# Patient Record
Sex: Male | Born: 1965 | Race: White | Hispanic: No | Smoking: Never smoker
Health system: Southern US, Community
[De-identification: ages and names within clinical notes are randomized; demographics above are authoritative.]

## PROBLEM LIST (undated history)

## (undated) DIAGNOSIS — B019 Varicella without complication: Secondary | ICD-10-CM

## (undated) HISTORY — PX: MENISCUS REPAIR: SHX5179

## (undated) HISTORY — DX: Varicella without complication: B01.9

---

## 2009-12-30 ENCOUNTER — Emergency Department: Payer: Self-pay | Admitting: Emergency Medicine

## 2010-06-13 IMAGING — CR RIGHT HIP - COMPLETE 2+ VIEW
1 series · 2 of 2 positions shown · non-contrast
Comparison: none

REASON FOR EXAM: right hip pain
COMMENTS:

PROCEDURE:     DXR - DXR HIP RIGHT COMPLETE  - December 30, 2009  [DATE]
RESULT:     No fracture, dislocation or other acute bony abnormality is
identified. The hip joint space is well-maintained.

[Series 1: view not recorded · 0.17mm/px · 2 of 2 slices shown]
[im 1/2]
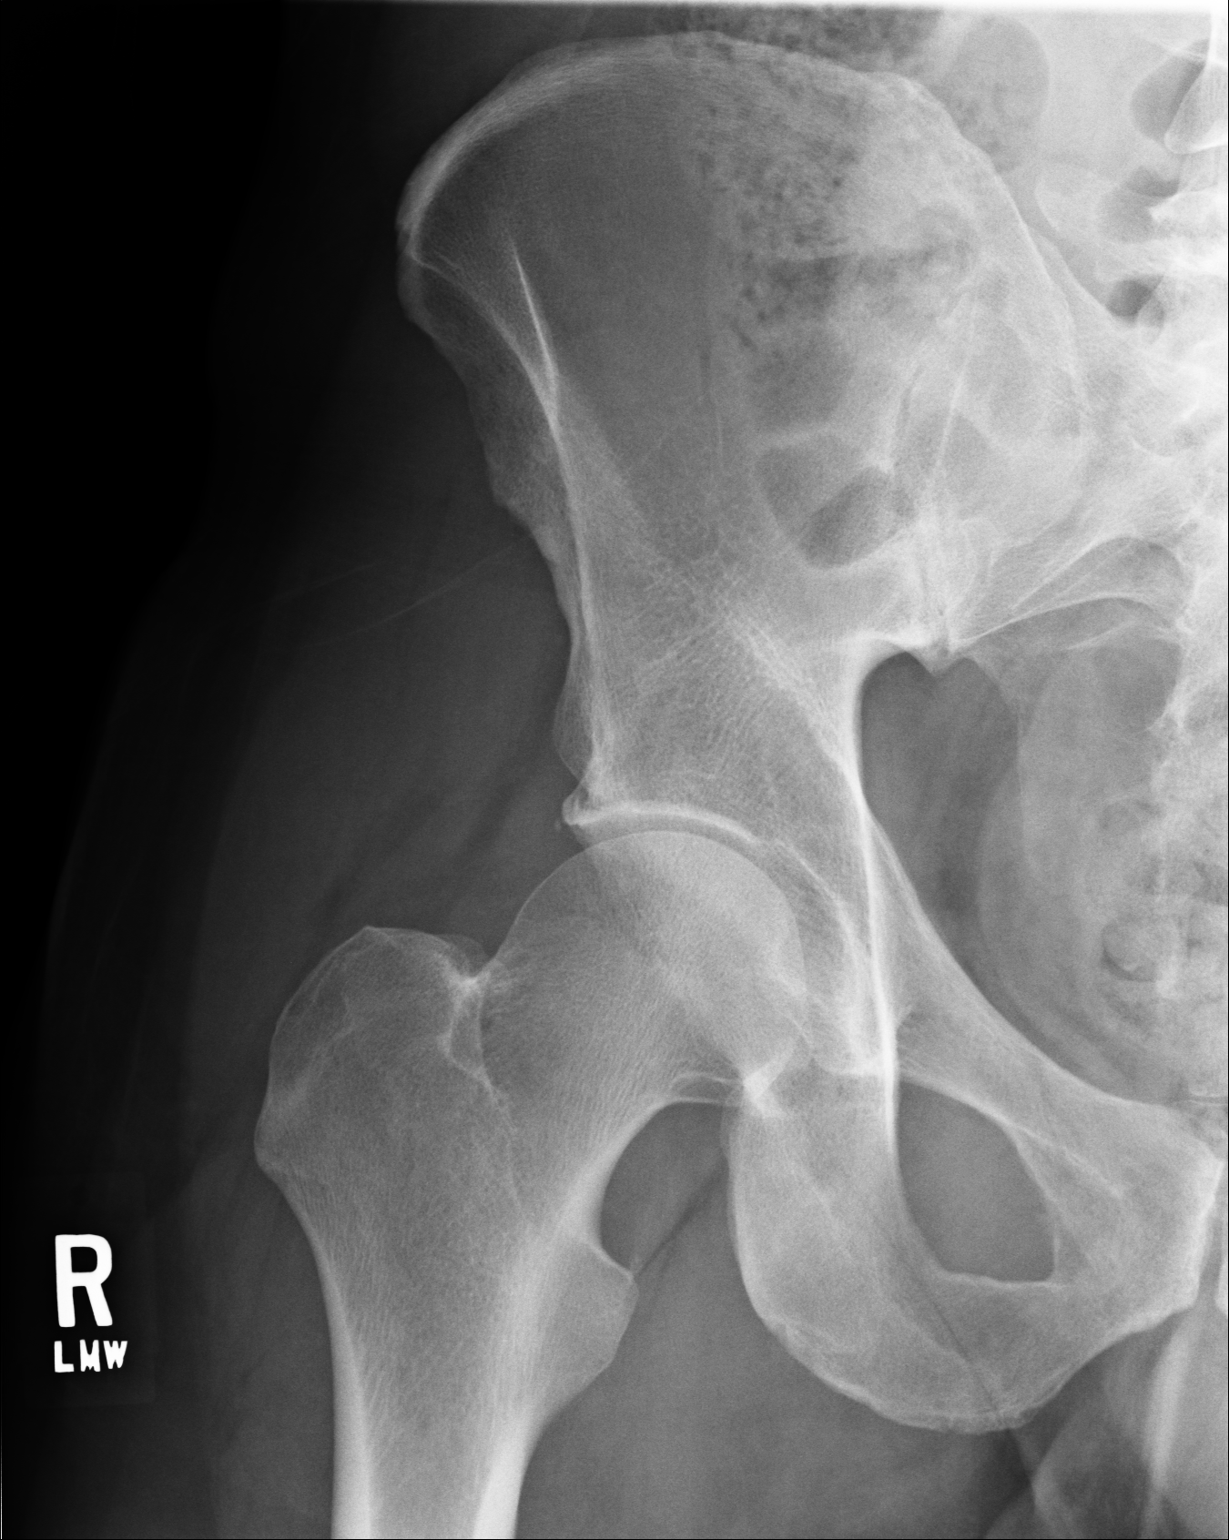
[im 2/2]
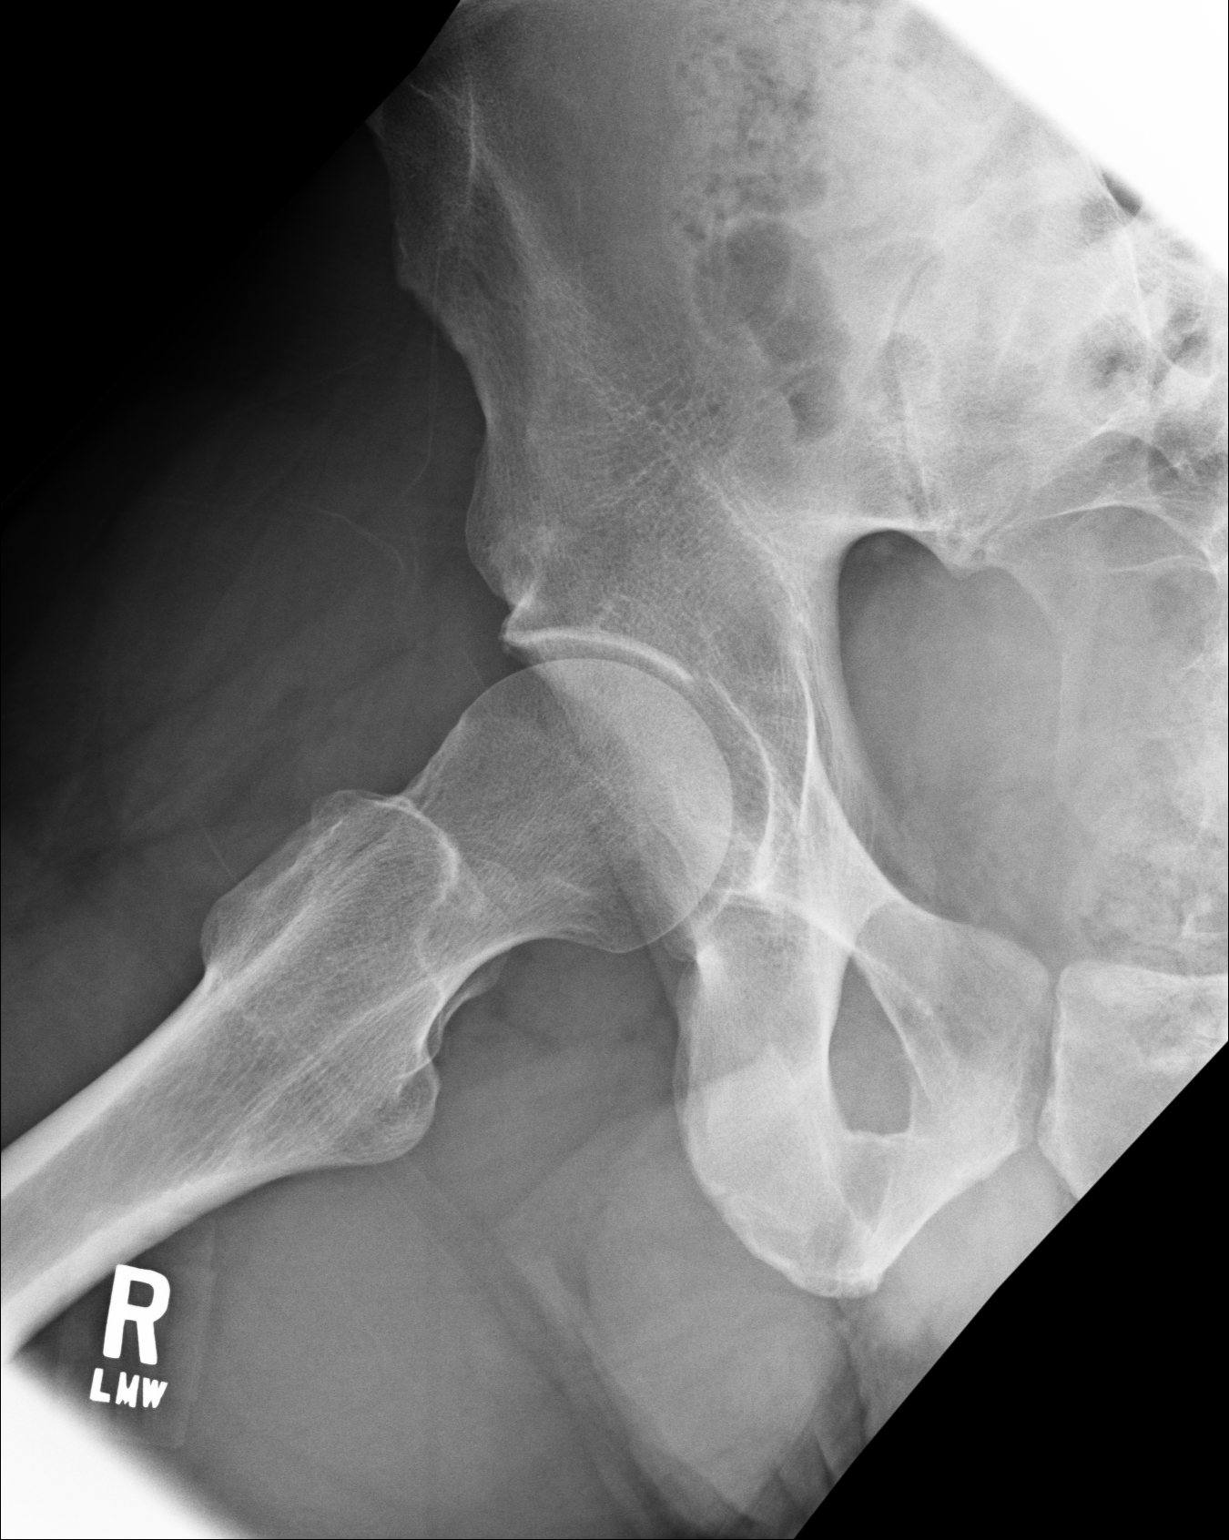

[2 of 2 positions shown; findings below may reference images not displayed]

IMPRESSION: No acute changes are identified.

## 2015-05-05 ENCOUNTER — Encounter: Payer: Self-pay | Admitting: Primary Care

## 2015-05-05 ENCOUNTER — Encounter (INDEPENDENT_AMBULATORY_CARE_PROVIDER_SITE_OTHER): Payer: Self-pay

## 2015-05-05 ENCOUNTER — Ambulatory Visit (INDEPENDENT_AMBULATORY_CARE_PROVIDER_SITE_OTHER): Payer: BLUE CROSS/BLUE SHIELD | Admitting: Primary Care

## 2015-05-05 VITALS — BP 136/76 | HR 65 | Temp 98.3°F | Ht 71.0 in | Wt 211.1 lb

## 2015-05-05 DIAGNOSIS — Z0000 Encounter for general adult medical examination without abnormal findings: Secondary | ICD-10-CM | POA: Insufficient documentation

## 2015-05-05 DIAGNOSIS — Z23 Encounter for immunization: Secondary | ICD-10-CM

## 2015-05-05 LAB — CBC WITH DIFFERENTIAL/PLATELET
Basophils Absolute: 0 10*3/uL (ref 0.0–0.1)
Basophils Relative: 0.5 % (ref 0.0–3.0)
Eosinophils Absolute: 0.2 10*3/uL (ref 0.0–0.7)
Eosinophils Relative: 4 % (ref 0.0–5.0)
HCT: 41.6 % (ref 39.0–52.0)
Hemoglobin: 14.4 g/dL (ref 13.0–17.0)
Lymphocytes Relative: 30.9 % (ref 12.0–46.0)
Lymphs Abs: 1.7 10*3/uL (ref 0.7–4.0)
MCHC: 34.5 g/dL (ref 30.0–36.0)
MCV: 90 fl (ref 78.0–100.0)
Monocytes Absolute: 0.5 10*3/uL (ref 0.1–1.0)
Monocytes Relative: 8.8 % (ref 3.0–12.0)
Neutro Abs: 3 10*3/uL (ref 1.4–7.7)
Neutrophils Relative %: 55.8 % (ref 43.0–77.0)
Platelets: 196 10*3/uL (ref 150.0–400.0)
RBC: 4.62 Mil/uL (ref 4.22–5.81)
RDW: 12.5 % (ref 11.5–15.5)
WBC: 5.4 10*3/uL (ref 4.0–10.5)

## 2015-05-05 LAB — LIPID PANEL
CHOL/HDL RATIO: 4
Cholesterol: 208 mg/dL — ABNORMAL HIGH (ref 0–200)
HDL: 56.6 mg/dL (ref >39.00)
LDL Cholesterol: 125 mg/dL — ABNORMAL HIGH (ref 0–99)
NONHDL: 151.4
TRIGLYCERIDES: 131 mg/dL (ref 0.0–149.0)
VLDL: 26.2 mg/dL (ref 0.0–40.0)

## 2015-05-05 LAB — COMPREHENSIVE METABOLIC PANEL WITH GFR
ALT: 22 U/L (ref 0–53)
AST: 25 U/L (ref 0–37)
Albumin: 4.2 g/dL (ref 3.5–5.2)
Alkaline Phosphatase: 53 U/L (ref 39–117)
BUN: 19 mg/dL (ref 6–23)
CO2: 32 meq/L (ref 19–32)
Calcium: 9.6 mg/dL (ref 8.4–10.5)
Chloride: 102 meq/L (ref 96–112)
Creatinine, Ser: 1.12 mg/dL (ref 0.40–1.50)
GFR: 73.7 mL/min
Glucose, Bld: 96 mg/dL (ref 70–99)
Potassium: 4.4 meq/L (ref 3.5–5.1)
Sodium: 138 meq/L (ref 135–145)
Total Bilirubin: 0.5 mg/dL (ref 0.2–1.2)
Total Protein: 6.7 g/dL (ref 6.0–8.3)

## 2015-05-05 LAB — PSA: PSA: 0.72 ng/mL (ref 0.10–4.00)

## 2015-05-05 LAB — TSH: TSH: 2.06 u[IU]/mL (ref 0.35–4.50)

## 2015-05-05 NOTE — Assessment & Plan Note (Signed)
Requires physical for foster care application. He's not seen a PCP in 15-20 years. Tetanus unknown. Tdap today. Due for colonoscopy, declines.  No recent eye or dental exam, recommendations provided. Labs today. Will notify patient of results.

## 2015-05-05 NOTE — Patient Instructions (Signed)
You've been given a Tetanus vaccine today. You will be covered for 10 years. Complete lab work prior to leaving today. I will notify you of your results. Follow up in one year for repeat physical or sooner if needed.

## 2015-05-05 NOTE — Progress Notes (Signed)
Subjective:    Patient ID: Zachary LentJames D Hibbitts, male    DOB: 12/26/1965, 49 y.o.   MRN: [id number]  HPI  Mr. [name] is a 49 year old male who presents today to establish care and is requesting a physical which is required for foster care.   Immunizations: -Tetanus: Unknown, believes it's been over 10 years ago.  -Influenza: Did not receive last season.  Diet: Diet consists of salads, lean meat, vegetables, fruits. Limited fast food, sweets. Drinks mostly water, 1 glass of red wine at night.  Exercise: Exercises three days a week at the gym. Lifts weights, some cardio. Eye exam: None recently.  Dental exam: 10 years ago.  Colonoscopy: He has not had one and declines today.      Review of Systems  Constitutional: Negative for fatigue and unexpected weight change.  HENT: Negative for rhinorrhea.   Respiratory: Negative for cough and shortness of breath.   Cardiovascular: Negative for chest pain.  Gastrointestinal: Negative for diarrhea and constipation.  Genitourinary: Negative for dysuria, frequency and difficulty urinating.  Musculoskeletal: Negative for myalgias and arthralgias.  Skin: Negative for rash.  Allergic/Immunologic: Negative for environmental allergies.  Neurological: Negative for dizziness and headaches.  Psychiatric/Behavioral:       Denies concerns for anxiety or depression.       Past Medical History  Diagnosis Date  . Chicken pox     History   Social History  . Marital Status: Unknown    Spouse Name: N/A  . Number of Children: N/A  . Years of Education: N/A   Occupational History  . Not on file.   Social History Main Topics  . Smoking status: Never Smoker   . Smokeless tobacco: Not on file  . Alcohol Use: 0.0 oz/week    0 Standard drinks or equivalent per week     Comment: occ  . Drug Use: Not on file  . Sexual Activity: Not on file   Other Topics Concern  . Not on file   Social History Narrative   Works as a Insurance account managercomputer analysist   Married.    4 children.    Enjoys gardening, traveling.       Past Surgical History  Procedure Laterality Date  . Meniscus repair Right     Family History  Problem Relation Age of Onset  . Dementia Mother     No Known Allergies  No current outpatient prescriptions on file prior to visit.   No current facility-administered medications on file prior to visit.    BP 136/76 mmHg  Pulse 65  Temp(Src) 98.3 F (36.8 C) (Oral)  Ht 5\' 11"  (1.803 m)  Wt 211 lb 1.9 oz (95.763 kg)  BMI 29.46 kg/m2  SpO2 97%    Objective:   Physical Exam  Constitutional: He is oriented to person, place, and time. He appears well-nourished.  HENT:  Right Ear: Tympanic membrane and ear canal normal.  Left Ear: Tympanic membrane and ear canal normal.  Nose: Nose normal.  Mouth/Throat: Oropharynx is clear and moist.  Eyes: Conjunctivae and EOM are normal. Pupils are equal, round, and reactive to light.  Neck: Neck supple.  Cardiovascular: Normal rate and regular rhythm.   Pulmonary/Chest: Effort normal and breath sounds normal.  Abdominal: Soft. Bowel sounds are normal. He exhibits no mass. There is no tenderness.  Musculoskeletal: Normal range of motion.  Lymphadenopathy:    He has no cervical adenopathy.  Neurological: He is alert and oriented to person, place, and time. He  has normal reflexes. No cranial nerve deficit. Coordination normal.  Skin: Skin is warm and dry.  Psychiatric: He has a normal mood and affect.          Assessment & Plan:

## 2015-05-05 NOTE — Progress Notes (Signed)
Pre visit review using our clinic review tool, if applicable. No additional management support is needed unless otherwise documented below in the visit note. 

## 2015-05-06 ENCOUNTER — Encounter: Payer: Self-pay | Admitting: *Deleted

## 2015-05-06 ENCOUNTER — Telehealth: Payer: Self-pay | Admitting: Primary Care

## 2015-05-06 NOTE — Telephone Encounter (Signed)
Pts wife brought in a form to be filled out for pts physical done on 05/05/15.  Best phone number to contact pt at is [phone] per pts wife.  The form will be on Chan's desk.

## 2015-05-06 NOTE — Telephone Encounter (Signed)
Place in [place] Corner[place]'s inbox to be complete.

## 2015-05-09 NOTE — Telephone Encounter (Signed)
Completed.

## 2015-05-09 NOTE — Telephone Encounter (Signed)
Left voicemail for patient that paperwork is ready for pick. Left in front office. Including for patient to pick up--DSS form and letter with lab results and [name]'s comments.

## 2015-11-23 ENCOUNTER — Telehealth: Payer: Self-pay | Admitting: Primary Care

## 2015-11-23 NOTE — Telephone Encounter (Signed)
Sheri called back and verbally gave her the patient's BP so they can complete the foster care paperwork.

## 2015-11-23 NOTE — Telephone Encounter (Signed)
sherri called Jae D[place]Kate saw pt in may to fill out foster care forms.  Pt bp was not on form  She needs this Please advise

## 2015-11-23 NOTE — Telephone Encounter (Signed)
Message left for Mammie LorenzoSherri Fordto return my call.

## 2015-11-23 NOTE — Telephone Encounter (Signed)
sherri returned your call

## 2015-11-23 NOTE — Telephone Encounter (Signed)
Message left for Sherri Fordto return my call. 

## 2017-08-27 ENCOUNTER — Ambulatory Visit (INDEPENDENT_AMBULATORY_CARE_PROVIDER_SITE_OTHER): Payer: BLUE CROSS/BLUE SHIELD | Admitting: Primary Care

## 2017-08-27 ENCOUNTER — Encounter: Payer: Self-pay | Admitting: Primary Care

## 2017-08-27 ENCOUNTER — Other Ambulatory Visit: Payer: Self-pay | Admitting: Primary Care

## 2017-08-27 VITALS — BP 120/76 | HR 65 | Temp 98.3°F | Ht 71.0 in | Wt 208.8 lb

## 2017-08-27 DIAGNOSIS — Z125 Encounter for screening for malignant neoplasm of prostate: Secondary | ICD-10-CM

## 2017-08-27 DIAGNOSIS — Z0000 Encounter for general adult medical examination without abnormal findings: Secondary | ICD-10-CM | POA: Diagnosis not present

## 2017-08-27 DIAGNOSIS — E875 Hyperkalemia: Secondary | ICD-10-CM

## 2017-08-27 LAB — LIPID PANEL
Cholesterol: 205 mg/dL — ABNORMAL HIGH (ref 0–200)
HDL: 59.1 mg/dL (ref >39.00)
LDL CALC: 132 mg/dL — AB (ref 0–99)
NonHDL: 145.77
Total CHOL/HDL Ratio: 3
Triglycerides: 67 mg/dL (ref 0.0–149.0)
VLDL: 13.4 mg/dL (ref 0.0–40.0)

## 2017-08-27 LAB — COMPREHENSIVE METABOLIC PANEL
ALBUMIN: 4.2 g/dL (ref 3.5–5.2)
ALT: 25 U/L (ref 0–53)
AST: 26 U/L (ref 0–37)
Alkaline Phosphatase: 43 U/L (ref 39–117)
BILIRUBIN TOTAL: 0.5 mg/dL (ref 0.2–1.2)
BUN: 20 mg/dL (ref 6–23)
CHLORIDE: 102 meq/L (ref 96–112)
CO2: 31 meq/L (ref 19–32)
Calcium: 9.4 mg/dL (ref 8.4–10.5)
Creatinine, Ser: 1.17 mg/dL (ref 0.40–1.50)
GFR: 69.44 mL/min (ref >60.00)
Glucose, Bld: 92 mg/dL (ref 70–99)
Potassium: 5.4 mEq/L — ABNORMAL HIGH (ref 3.5–5.1)
Sodium: 139 mEq/L (ref 135–145)
Total Protein: 6.5 g/dL (ref 6.0–8.3)

## 2017-08-27 LAB — PSA: PSA: 1.26 ng/mL (ref 0.10–4.00)

## 2017-08-27 NOTE — Progress Notes (Signed)
Subjective:    Patient ID: Zachary Ryan, male    DOB: 11/05/1966, 51 y.o.   MRN: [id number]  HPI  Zachary Ryan is a 51 year old male who presents today for complete physical.  Immunizations: -Tetanus: Completed in 2016 -Influenza: Declines   Diet: He endorses a healthy diet. Breakfast: Protein shake, eggs Lunch: Protein shake, left overs Dinner: Chicken, beef, fish, vegetables, starch Snacks: None Desserts: Rarely  Beverages: Water, protein shakes, occasional beer, wine every night.  Exercise: Exercises three times weekly for 1 hour. Eye exam: Completed in 2018 Dental exam: Has not completed recently. Colonoscopy: Declines PSA: Due.   Review of Systems  Constitutional: Negative for unexpected weight change.  HENT: Negative for rhinorrhea.   Respiratory: Negative for cough and shortness of breath.   Cardiovascular: Negative for chest pain.  Gastrointestinal: Negative for constipation and diarrhea.  Genitourinary: Negative for difficulty urinating.  Musculoskeletal: Negative for arthralgias and myalgias.  Skin: Negative for rash.  Allergic/Immunologic: Negative for environmental allergies.  Neurological: Negative for dizziness, numbness and headaches.  Psychiatric/Behavioral:       Denies concerns for anxiety or depression       Past Medical History:  Diagnosis Date  . Chicken pox      Social History   Social History  . Marital status: Unknown    Spouse name: N/A  . Number of children: N/A  . Years of education: N/A   Occupational History  . Not on file.   Social History Main Topics  . Smoking status: Never Smoker  . Smokeless tobacco: Never Used  . Alcohol use 0.0 oz/week     Comment: occ  . Drug use: Unknown  . Sexual activity: Not on file   Other Topics Concern  . Not on file   Social History Narrative   Works as a Insurance account managercomputer analysist   Married.    4 children.    Enjoys gardening, traveling.       Past Surgical History:  Procedure  Laterality Date  . MENISCUS REPAIR Right     Family History  Problem Relation Age of Onset  . Dementia Mother     No Known Allergies  No current outpatient prescriptions on file prior to visit.   No current facility-administered medications on file prior to visit.     BP 120/76   Pulse 65   Temp 98.3 F (36.8 C) (Oral)   Ht 5\' 11"  (1.803 m)   Wt 208 lb 12.8 oz (94.7 kg)   SpO2 98%   BMI 29.12 kg/m    Objective:   Physical Exam  Constitutional: He is oriented to person, place, and time. He appears well-nourished.  HENT:  Right Ear: Tympanic membrane and ear canal normal.  Left Ear: Tympanic membrane and ear canal normal.  Nose: Nose normal. Right sinus exhibits no maxillary sinus tenderness and no frontal sinus tenderness. Left sinus exhibits no maxillary sinus tenderness and no frontal sinus tenderness.  Mouth/Throat: Oropharynx is clear and moist.  Eyes: Pupils are equal, round, and reactive to light. Conjunctivae and EOM are normal.  Neck: Neck supple. Carotid bruit is not present. No thyromegaly present.  Cardiovascular: Normal rate, regular rhythm and normal heart sounds.   Pulmonary/Chest: Effort normal and breath sounds normal. He has no wheezes. He has no rales.  Abdominal: Soft. Bowel sounds are normal. There is no tenderness.  Musculoskeletal: Normal range of motion.  Neurological: He is alert and oriented to person, place, and time. He has normal  reflexes. No cranial nerve deficit.  Skin: Skin is warm and dry.  Psychiatric: He has a normal mood and affect.          Assessment & Plan:

## 2017-08-27 NOTE — Assessment & Plan Note (Signed)
Td UTD, declines influenza vaccination. PSA pending. Declines colonoscopy despite recommendations. Commended him on his regular activity, discussed to increase vegetables, fruit, whole grains. Exam unremarkable. Labs pending. Follow up annually. Form completed today.

## 2017-08-27 NOTE — Patient Instructions (Signed)
Complete lab work prior to leaving today. I will notify you of your results once received.   Continue exercising. You should be getting 150 minutes of moderate intensity exercise weekly.  Ensure you are consuming 64 ounces of water daily.  I do recommend a colonoscopy to screen for colon cancer.  Follow up in 1 year for your annual exam or sooner if needed.  It was a pleasure to see you today!

## 2017-09-05 ENCOUNTER — Other Ambulatory Visit: Payer: BLUE CROSS/BLUE SHIELD

## 2018-08-06 ENCOUNTER — Telehealth: Payer: Self-pay | Admitting: Primary Care

## 2018-08-06 NOTE — Telephone Encounter (Signed)
Okay to schedule sooner as long it is not next to another CPE, hospital follow up, or new patient.

## 2018-08-06 NOTE — Telephone Encounter (Signed)
Spouse schedule cpx for pt 10/16/18 last cpx 08/27/18 she wanted to know if pt could be seen sooner Please advise

## 2018-08-08 NOTE — Telephone Encounter (Signed)
Left message asking pt to call office  °

## 2018-10-16 ENCOUNTER — Ambulatory Visit (INDEPENDENT_AMBULATORY_CARE_PROVIDER_SITE_OTHER): Payer: BLUE CROSS/BLUE SHIELD | Admitting: Primary Care

## 2018-10-16 ENCOUNTER — Encounter: Payer: Self-pay | Admitting: Primary Care

## 2018-10-16 DIAGNOSIS — Z0000 Encounter for general adult medical examination without abnormal findings: Secondary | ICD-10-CM | POA: Diagnosis not present

## 2018-10-16 LAB — COMPREHENSIVE METABOLIC PANEL
ALK PHOS: 46 U/L (ref 39–117)
ALT: 27 U/L (ref 0–53)
AST: 28 U/L (ref 0–37)
Albumin: 4.3 g/dL (ref 3.5–5.2)
BILIRUBIN TOTAL: 0.7 mg/dL (ref 0.2–1.2)
BUN: 22 mg/dL (ref 6–23)
CO2: 32 meq/L (ref 19–32)
CREATININE: 1.26 mg/dL (ref 0.40–1.50)
Calcium: 9.5 mg/dL (ref 8.4–10.5)
Chloride: 103 mEq/L (ref 96–112)
GFR: 63.47 mL/min (ref >60.00)
GLUCOSE: 92 mg/dL (ref 70–99)
Potassium: 5.5 mEq/L — ABNORMAL HIGH (ref 3.5–5.1)
Sodium: 139 mEq/L (ref 135–145)
TOTAL PROTEIN: 6.8 g/dL (ref 6.0–8.3)

## 2018-10-16 LAB — LIPID PANEL
CHOL/HDL RATIO: 3
Cholesterol: 212 mg/dL — ABNORMAL HIGH (ref 0–200)
HDL: 67 mg/dL (ref >39.00)
LDL Cholesterol: 133 mg/dL — ABNORMAL HIGH (ref 0–99)
NONHDL: 145.1
Triglycerides: 63 mg/dL (ref 0.0–149.0)
VLDL: 12.6 mg/dL (ref 0.0–40.0)

## 2018-10-16 NOTE — Progress Notes (Signed)
Subjective:    Patient ID: Zachary Ryan, male    DOB: 1966/08/15, 52 y.o.   MRN: [id number]  HPI  Zachary Ryan is a 52 year old male who presents today for complete physical.  Immunizations: -Tetanus: Completed in 2016 -Influenza: Declines    Diet: He endorses a healthy diet Breakfast: Eggs, veggies, breakfast bar Lunch: Left overs Dinner: Meat, vegetables, starch Snacks: None Desserts: None Beverages: Water, wine, coffee  Exercise: He is exercising three times weekly at home Eye exam: Completed years ago Dental exam: No recent exam  Colonoscopy: Never completed, declines PSA: Normal  In 2018   Review of Systems  Constitutional: Negative for unexpected weight change.  HENT: Negative for rhinorrhea.   Respiratory: Negative for cough and shortness of breath.   Cardiovascular: Negative for chest pain.  Gastrointestinal: Negative for constipation and diarrhea.  Genitourinary: Negative for difficulty urinating.  Musculoskeletal: Negative for arthralgias and myalgias.  Skin: Negative for rash.  Allergic/Immunologic: Negative for environmental allergies.  Neurological: Negative for dizziness, numbness and headaches.  Psychiatric/Behavioral: The patient is not nervous/anxious.        Past Medical History:  Diagnosis Date  . Chicken pox      Social History   Socioeconomic History  . Marital status: Unknown    Spouse name: Not on file  . Number of children: Not on file  . Years of education: Not on file  . Highest education level: Not on file  Occupational History  . Not on file  Social Needs  . Financial resource strain: Not on file  . Food insecurity:    Worry: Not on file    Inability: Not on file  . Transportation needs:    Medical: Not on file    Non-medical: Not on file  Tobacco Use  . Smoking status: Never Smoker  . Smokeless tobacco: Never Used  Substance and Sexual Activity  . Alcohol use: Yes    Alcohol/week: 0.0 standard drinks    Comment:  occ  . Drug use: Not on file  . Sexual activity: Not on file  Lifestyle  . Physical activity:    Days per week: Not on file    Minutes per session: Not on file  . Stress: Not on file  Relationships  . Social connections:    Talks on phone: Not on file    Gets together: Not on file    Attends religious service: Not on file    Active member of club or organization: Not on file    Attends meetings of clubs or organizations: Not on file    Relationship status: Not on file  . Intimate partner violence:    Fear of current or ex partner: Not on file    Emotionally abused: Not on file    Physically abused: Not on file    Forced sexual activity: Not on file  Other Topics Concern  . 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    Enjoys gardening, traveling.     Family History  Problem Relation Age of Onset  . Dementia Mother     No Known Allergies  No current outpatient medications on file prior to visit.   No current facility-administered medications on file prior to visit.     BP 126/86   Pulse (!) 59   Temp 98.2 F (36.8 C) (Oral)   Ht 5\' 11"  (1.803 m)  Wt 204 lb 8 oz (92.8 kg)   SpO2 98%   BMI 28.52 kg/m    Objective:   Physical Exam  Constitutional: He is oriented to person, place, and time. He appears well-nourished.  HENT:  Mouth/Throat: No oropharyngeal exudate.  Eyes: Pupils are equal, round, and reactive to light. EOM are normal.  Neck: Neck supple. No thyromegaly present.  Cardiovascular: Normal rate and regular rhythm.  Respiratory: Effort normal and breath sounds normal.  GI: Soft. Bowel sounds are normal. There is no tenderness.  Musculoskeletal: Normal range of motion.  Neurological: He is alert and oriented to person, place, and time.  Skin: Skin is warm and dry.  Psychiatric: He has a normal mood and affect.           Assessment & Plan:

## 2018-10-16 NOTE — Assessment & Plan Note (Signed)
Tetanus UTD, declines influenza vaccination. PSA UTD. Declines colonoscopy despite recommendations. Discussed to continue with regular exercise, work on a healthy diet. Exam unremarkable. Labs pending. Follow up in 1 year for CPE.

## 2018-10-16 NOTE — Patient Instructions (Signed)
Stop by the lab prior to leaving today. I will notify you of your results once received.   Continue exercising. You should be getting 150 minutes of moderate intensity exercise weekly.  Continue to work on [employer]. Make sure to eat plenty of vegetables, fruit, whole grains, lean protein.  We will see you in one year for your annual exam or sooner if needed.  It was a pleasure to see you today!   Preventive Care 40-64 Years, Male Preventive care refers to lifestyle choices and visits with your health care provider that can promote health and wellness. What does preventive care include?  A yearly physical exam. This is also called an annual well check.  Dental exams once or twice a year.  Routine eye exams. Ask your health care provider how often you should have your eyes checked.  Personal lifestyle choices, including: ? Daily care of your teeth and gums. ? Regular physical activity. ? Eating a healthy diet. ? Avoiding tobacco and drug use. ? Limiting alcohol use. ? Practicing safe sex. ? Taking low-dose aspirin every day starting at age 43. What happens during an annual well check? The services and screenings done by your health care provider during your annual well check will depend on your age, overall health, lifestyle risk factors, and family history of disease. Counseling Your health care provider may ask you questions about your:  Alcohol use.  Tobacco use.  Drug use.  Emotional well-being.  Home and relationship well-being.  Sexual activity.  Eating habits.  Work and work Statistician.  Screening You may have the following tests or measurements:  Height, weight, and BMI.  Blood pressure.  Lipid and cholesterol levels. These may be checked every 5 years, or more frequently if you are over 35 years old.  Skin check.  Lung cancer screening. You may have this screening every year starting at age 50 if you have a 30-pack-year history of smoking and  currently smoke or have quit within the past 15 years.  Fecal occult blood test (FOBT) of the stool. You may have this test every year starting at age 71.  Flexible sigmoidoscopy or colonoscopy. You may have a sigmoidoscopy every 5 years or a colonoscopy every 10 years starting at age 38.  Prostate cancer screening. Recommendations will vary depending on your family history and other risks.  Hepatitis C blood test.  Hepatitis B blood test.  Sexually transmitted disease (STD) testing.  Diabetes screening. This is done by checking your blood sugar (glucose) after you have not eaten for a while (fasting). You may have this done every 1-3 years.  Discuss your test results, treatment options, and if necessary, the need for more tests with your health care provider. Vaccines Your health care provider may recommend certain vaccines, such as:  Influenza vaccine. This is recommended every year.  Tetanus, diphtheria, and acellular pertussis (Tdap, Td) vaccine. You may need a Td booster every 10 years.  Varicella vaccine. You may need this if you have not been vaccinated.  Zoster vaccine. You may need this after age 8.  Measles, mumps, and rubella (MMR) vaccine. You may need at least one dose of MMR if you were born in 1957 or later. You may also need a second dose.  Pneumococcal 13-valent conjugate (PCV13) vaccine. You may need this if you have certain conditions and have not been vaccinated.  Pneumococcal polysaccharide (PPSV23) vaccine. You may need one or two doses if you smoke cigarettes or if you have certain  conditions.  Meningococcal vaccine. You may need this if you have certain conditions.  Hepatitis A vaccine. You may need this if you have certain conditions or if you travel or work in places where you may be exposed to hepatitis A.  Hepatitis B vaccine. You may need this if you have certain conditions or if you travel or work in places where you may be exposed to hepatitis  B.  Haemophilus influenzae type b (Hib) vaccine. You may need this if you have certain risk factors.  Talk to your health care provider about which screenings and vaccines you need and how often you need them. This information is not intended to replace advice given to you by your health care provider. Make sure you discuss any questions you have with your health care provider. Document Released: 12/30/2015 Document Revised: 08/22/2016 Document Reviewed: 10/04/2015 Elsevier Interactive Patient Education  [employer].

## 2023-05-21 ENCOUNTER — Inpatient Hospital Stay
Admission: AD | Admit: 2023-05-21 | Discharge: 2023-05-21 | DRG: 882 | Disposition: A | Discharge status: Home / Self Care | Payer: 59 | Source: Intra-hospital | Attending: Psychiatry | Admitting: Psychiatry

## 2023-05-21 ENCOUNTER — Other Ambulatory Visit: Payer: Self-pay

## 2023-05-21 ENCOUNTER — Encounter: Payer: Self-pay | Admitting: *Deleted

## 2023-05-21 ENCOUNTER — Encounter: Payer: Self-pay | Admitting: Psychiatry

## 2023-05-21 ENCOUNTER — Emergency Department
Admission: EM | Admit: 2023-05-21 | Discharge: 2023-05-21 | Disposition: A | Discharge status: Other Acute Inpatient Hospital | Payer: 59 | Attending: Emergency Medicine | Admitting: Emergency Medicine

## 2023-05-21 DIAGNOSIS — Z20822 Contact with and (suspected) exposure to covid-19: Secondary | ICD-10-CM | POA: Diagnosis present

## 2023-05-21 DIAGNOSIS — F4325 Adjustment disorder with mixed disturbance of emotions and conduct: Secondary | ICD-10-CM | POA: Insufficient documentation

## 2023-05-21 DIAGNOSIS — R45851 Suicidal ideations: Secondary | ICD-10-CM | POA: Diagnosis present

## 2023-05-21 DIAGNOSIS — F1010 Alcohol abuse, uncomplicated: Secondary | ICD-10-CM | POA: Insufficient documentation

## 2023-05-21 DIAGNOSIS — Y901 Blood alcohol level of 20-39 mg/100 ml: Secondary | ICD-10-CM | POA: Diagnosis not present

## 2023-05-21 DIAGNOSIS — F32A Depression, unspecified: Secondary | ICD-10-CM | POA: Diagnosis present

## 2023-05-21 DIAGNOSIS — F29 Unspecified psychosis not due to a substance or known physiological condition: Secondary | ICD-10-CM | POA: Diagnosis present

## 2023-05-21 LAB — CBC
HCT: 45.8 % (ref 39.0–52.0)
Hemoglobin: 15.8 g/dL (ref 13.0–17.0)
MCH: 32 pg (ref 26.0–34.0)
MCHC: 34.5 g/dL (ref 30.0–36.0)
MCV: 92.9 fL (ref 80.0–100.0)
Platelets: 196 10*3/uL (ref 150–400)
RBC: 4.93 MIL/uL (ref 4.22–5.81)
RDW: 11.6 % (ref 11.5–15.5)
WBC: 7.9 10*3/uL (ref 4.0–10.5)
nRBC: 0 % (ref 0.0–0.2)

## 2023-05-21 LAB — ACETAMINOPHEN LEVEL: Acetaminophen (Tylenol), Serum: <10 ug/mL — ABNORMAL LOW (ref 10–30)

## 2023-05-21 LAB — SALICYLATE LEVEL: Salicylate Lvl: <7 mg/dL — ABNORMAL LOW (ref 7.0–30.0)

## 2023-05-21 LAB — URINE DRUG SCREEN, QUALITATIVE (ARMC ONLY)
Amphetamines, Ur Screen: NOT DETECTED
Barbiturates, Ur Screen: NOT DETECTED
Benzodiazepine, Ur Scrn: NOT DETECTED
Cannabinoid 50 Ng, Ur ~~LOC~~: NOT DETECTED
Cocaine Metabolite,Ur ~~LOC~~: NOT DETECTED
MDMA (Ecstasy)Ur Screen: NOT DETECTED
Methadone Scn, Ur: NOT DETECTED
Opiate, Ur Screen: NOT DETECTED
Phencyclidine (PCP) Ur S: NOT DETECTED
Tricyclic, Ur Screen: NOT DETECTED

## 2023-05-21 LAB — COMPREHENSIVE METABOLIC PANEL
ALT: 34 U/L (ref 0–44)
AST: 42 U/L — ABNORMAL HIGH (ref 15–41)
Albumin: 4.8 g/dL (ref 3.5–5.0)
Alkaline Phosphatase: 51 U/L (ref 38–126)
Anion gap: 13 (ref 5–15)
BUN: 16 mg/dL (ref 6–20)
CO2: 21 mmol/L — ABNORMAL LOW (ref 22–32)
Calcium: 9.1 mg/dL (ref 8.9–10.3)
Chloride: 103 mmol/L (ref 98–111)
Creatinine, Ser: 1.16 mg/dL (ref 0.61–1.24)
GFR, Estimated: >60 mL/min (ref >60)
Glucose, Bld: 97 mg/dL (ref 70–99)
Potassium: 3.9 mmol/L (ref 3.5–5.1)
Sodium: 137 mmol/L (ref 135–145)
Total Bilirubin: 1 mg/dL (ref 0.3–1.2)
Total Protein: 7.6 g/dL (ref 6.5–8.1)

## 2023-05-21 LAB — SARS CORONAVIRUS 2 BY RT PCR: SARS Coronavirus 2 by RT PCR: NEGATIVE

## 2023-05-21 LAB — ETHANOL: Alcohol, Ethyl (B): 24 mg/dL — ABNORMAL HIGH (ref <10)

## 2023-05-21 MED ORDER — MAGNESIUM HYDROXIDE 400 MG/5ML PO SUSP: 30.0000 mL | Freq: Every day | ORAL | Status: DC | PRN

## 2023-05-21 MED ORDER — ZIPRASIDONE MESYLATE 20 MG IM SOLR: 20.0000 mg | INTRAMUSCULAR | Status: DC | PRN

## 2023-05-21 MED ORDER — ALUM & MAG HYDROXIDE-SIMETH 200-200-20 MG/5ML PO SUSP: 30.0000 mL | ORAL | Status: DC | PRN

## 2023-05-21 MED ORDER — LORAZEPAM 1 MG PO TABS: 1.0000 mg | ORAL_TABLET | ORAL | Status: DC | PRN

## 2023-05-21 MED ORDER — OLANZAPINE 10 MG PO TBDP: 10.0000 mg | ORAL_TABLET | Freq: Three times a day (TID) | ORAL | Status: DC | PRN

## 2023-05-21 MED ORDER — ACETAMINOPHEN 325 MG PO TABS: 650.0000 mg | ORAL_TABLET | Freq: Four times a day (QID) | ORAL | Status: DC | PRN

## 2023-05-21 NOTE — ED Provider Notes (Addendum)
[facility] Provider Note    Event Date/Time   First MD Initiated Contact with Patient 05/21/23 0216     (approximate)   History   Psychiatric Evaluation   HPI Zachary Ryan is a 57 y.o. male who denies chronic medical issues and denies substance abuse.  He presents under involuntary commitment by [facility].  The patient reports that he has recently lost a large amount of money in the stock market (approximately $500,000) and that he was starting to think that his family would be better off without him, if they could just collect his insurance money.  He reportedly sent some worrisome texts to his wife along this affect and then had a couple of beers.  He was picked up by the [facility] police while walking along the railroad tracks.  The patient reports and confirms feeling and saying things that he apparently said, but also said that he understands that suicide is not the answer to the issues.  He denies any acute medical complaints or concerns.  He said that the beer he had was hours ago.     Physical Exam   Triage Vital Signs: ED Triage Vitals  Enc Vitals Group     BP 05/21/23 0113 (!) 152/105     Pulse Rate 05/21/23 0110 100     Resp 05/21/23 0110 18     Temp 05/21/23 0110 98 F (36.7 C)     Temp Source 05/21/23 0110 Oral     SpO2 05/21/23 0110 98 %     Weight 05/21/23 0111 90.7 kg (200 lb)     Height 05/21/23 0111 1.778 m (5\' 10" )     Head Circumference --      Peak Flow --      Pain Score --      Pain Loc --      Pain Edu? --      Excl. in GC? --     Most recent vital signs: Vitals:   05/21/23 0110 05/21/23 0113  BP:  (!) 152/105  Pulse: 100   Resp: 18   Temp: 98 F (36.7 C)   SpO2: 98%     General: Awake, no distress.  Appears well.  CV:  Good peripheral perfusion.  Regular rate and rhythm. Resp:  Normal effort. Speaking easily and comfortably, no accessory muscle usage nor intercostal retractions.   Abd:  No  distention.  Other:  Calm, cooperative, mood and affect seem normal.  Patient admits to comments about depression and suicidal ideation and how his family might be better off without him, but also acknowledges that this is not an appropriate solution.  Difficult to assess insight and judgment given the current circumstances.   ED Results / Procedures / Treatments   Labs (all labs ordered are listed, but only abnormal results are displayed) Labs Reviewed  COMPREHENSIVE METABOLIC PANEL - Abnormal; Notable for the following components:      Result Value   CO2 21 (*)    AST 42 (*)    All other components within normal limits  ETHANOL - Abnormal; Notable for the following components:   Alcohol, Ethyl (B) 24 (*)    All other components within normal limits  SALICYLATE LEVEL - Abnormal; Notable for the following components:   Salicylate Lvl <7.0 (*)    All other components within normal limits  ACETAMINOPHEN LEVEL - Abnormal; Notable for the following components:   Acetaminophen (Tylenol), Serum <10 (*)    All  other components within normal limits  SARS CORONAVIRUS 2 BY RT PCR  CBC  URINE DRUG SCREEN, QUALITATIVE ([facility] ONLY)     PROCEDURES:  Critical Care performed: No  Procedures    IMPRESSION / MDM / ASSESSMENT AND PLAN / ED COURSE  I reviewed the triage vital signs and the nursing notes.                              Differential diagnosis includes, but is not limited to, depression, adjustment disorder, mood disorder, possible substance abuse.  Patient's presentation is most consistent with acute presentation with potential threat to life or bodily function.  Labs/studies ordered: As per protocol, I ordered the following labs as part of the patient's medical and psychiatric evaluation:  CBC, CMP, ethanol level, acetaminophen level, salicylate level, urine drug screen, COVID swab.  Interventions/Medications given:  Medications - No data to display  (Note:  hospital  course my include additional interventions and/or labs/studies not listed above.)   Patient has no medical complaints or concerns and normal vital signs.  No evidence of injury.  He is clinically sober and his ethanol level is less than 30.  He has a very reassuring medical workup including no specific lab abnormalities and a negative urine drug screen.  The patient is medically cleared for psychiatric consultation which I have ordered.  The patient has been placed in psychiatric observation due to the need to provide a safe environment for the patient while obtaining psychiatric consultation and evaluation, as well as ongoing medical and medication management to treat the patient's condition.  The patient has been placed under full IVC at this time.    Clinical Course as of 05/21/23 0451  Tue May 21, 2023  0354 Jasmine (TTS) evaluated the patient and staffed the patient with Channing Mutters (psych NP in [place]).  Channing Mutters unable to evaluate the patient with telepsychiatry at this time but recommended inpatient treatment.  Leavy Cella will work on placement. [CF]  0451 Patient is being excepted tonight down to the [facility] behavioral unit [CF]    Clinical Course User Index [CF] Loleta Rose, MD     FINAL CLINICAL IMPRESSION(S) / ED DIAGNOSES   Final diagnoses:  Depression, unspecified depression type  Suicidal ideation     Rx / DC Orders   ED Discharge Orders     None        Note:  This document was prepared using Dragon voice recognition software and may include unintentional dictation errors.   Loleta Rose, MD 05/21/23 Glynis Smiles    Loleta Rose, MD 05/21/23 [phone]

## 2023-05-21 NOTE — BH Assessment (Signed)
Comprehensive Clinical Assessment (CCA) Note  05/21/2023 [name] [id number] Recommendations for Services/Supports/Treatments: Consulted with Zachary Guadeloupe, NP who determined pt. meets inpatient criteria.  Zachary Ryan is a 57 year old, English speaking, Caucasian male with no known psych hx. Pt presents under IVC. Per triage note: Pt brought in bpd.  Pt is IVC.  Pt denies SI or HI.  Pt denies drug use.  Pt reports drinking 2 beers. Pt was walking on the railroad tracks tonight per pt.   On assessment, pt. presented with clear and coherent speech. Pt identified the reason he'd presented to the ED as "I lost $500,00 in trading in the last couple of days and there's pressure surrounding that". Of note, the pt. avoided eye contact and pt's affect was incongruent with the content of his conversation throughout the assessment. Pt's answers were relevant to the questions asked. Pt was adamant that while he'd decided to walk on the railroad tracks, he was unable to follow through and jump in front of the trains. Pt expressed feelings of guilt and disappointment about losing his and his family's money. Pt reported that his sleep and appetite have been normal, explaining that his distress is situational. Pt endorsed having symptoms of worry/tension; however, pt. reported having no mental health problems prior to losing money yesterday. Pt reported that his main stressors are feeling as if he'd disappointed his family, in addition to grief emotions about the amount of money he'd lost. Pt explained that he'd prayed about it and now knows that he could make the money back with time. Pt reported that his family is supportive. Pt forthcoming and expansive throughout the assessment. Pt reported having 2 beers prior to arrival. Pt does not find his drinking to be problematic. Pt had improving insight and dangerous judgement. Pt had an anxious, silly affect and a dysphoric mood. Pt denied current SI/HI/AV/H. Pt  identified his faith and his family as anchors. Pt's UDS is unremarkable; BAL is 24.   Chief Complaint:  Chief Complaint  Patient presents with   Psychiatric Evaluation   Visit Diagnosis: Stress reaction with mixed disturbance of emotion and conduct     CCA Screening, Triage and Referral (STR)  Patient Reported Information How did you hear about [place]? Other (Comment) Mudlogger)  Referral name: No data recorded Referral phone number: No data recorded  Whom do you see for routine medical problems? No data recorded Practice/Facility Name: No data recorded Practice/Facility Phone Number: No data recorded Name of Contact: No data recorded Contact Number: No data recorded Contact Fax Number: No data recorded Prescriber Name: No data recorded Prescriber Address (if known): No data recorded  What Is the Reason for Your Visit/Call Today? Pt brought in bpd.  Pt is IVC.  Pt denies SI or HI.  Pt denies drug use.  Pt reports drinking 2 beers. Pt was walking on the railroad tracks tonight per pt.  How Long Has This Been Causing You Problems? <Week  What Do You Feel Would Help You the Most Today? Stress Management   Have You Recently Been in Any Inpatient Treatment (Hospital/Detox/Crisis Center/28-Day Program)? No data recorded Name/Location of Program/Hospital:No data recorded How Long Were You There? No data recorded When Were You Discharged? No data recorded  Have You Ever Received Services From [facility] Before? No data recorded Who Do You See at [facility]? No data recorded  Have You Recently Had Any Thoughts About Hurting Yourself? Yes  Are You Planning to Commit Suicide/Harm Yourself At  This time? No   Have you Recently Had Thoughts About Hurting Someone Zachary Ryan? No  Explanation: n/a   Have You Used Any Alcohol or Drugs in the Past 24 Hours? Yes  How Long Ago Did You Use Drugs or Alcohol? No data recorded What Did You Use and How Much? Pt reported drinking 2 beers  05/20/23.   Do You Currently Have a Therapist/Psychiatrist? No  Name of Therapist/Psychiatrist: n/a   Have You Been Recently Discharged From Any Office Practice or Programs? No  Explanation of Discharge From Practice/Program: n/a     CCA Screening Triage Referral Assessment Type of Contact: Face-to-Face  Is this Initial or Reassessment? No data recorded Date Telepsych consult ordered in [facility]:  No data recorded Time Telepsych consult ordered in [facility]:  No data recorded  Patient Reported Information Reviewed? No data recorded Patient Left Without Being Seen? No data recorded Reason for Not Completing Assessment: No data recorded  Collateral Involvement: Michaeal, Tylutki (Spouse) [phone]   Does Patient Have a Court Appointed Legal Guardian? No data recorded Name and Contact of Legal Guardian: No data recorded If Minor and Not Living with Parent(s), Who has Custody? n/a  Is CPS involved or ever been involved? Never  Is APS involved or ever been involved? Never   Patient Determined To Be At Risk for Harm To Self or Others Based on Review of Patient Reported Information or Presenting Complaint? Yes, for Self-Harm  Method: Plan without intent  Availability of Means: In hand or used  Intent: Vague intent or NA  Notification Required: No need or identified person  Additional Information for Danger to Others Potential: -- (n/a)  Additional Comments for Danger to Others Potential: n/a  Are There Guns or Other Weapons in Your Home? Yes  Types of Guns/Weapons: Pt reported that he has guns in the home.  Are These Weapons Safely Secured?                            Yes  Who Could Verify You Are Able To Have These Secured: Martinez, [place] (Spouse) [phone]  Do You Have any Outstanding Charges, Pending Court Dates, Parole/Probation? None reported  Contacted To Inform of Risk of Harm To Self or Others: Family/Significant Other:   Location of Assessment: [facility] ED   Does  Patient Present under Involuntary Commitment? Yes  IVC Papers Initial File Date: No data recorded  [place] of Residence: [place]   Patient Currently Receiving the Following Services: Not Receiving Services   Determination of Need: Emergent (2 hours)   Options For Referral: Inpatient Hospitalization     CCA Biopsychosocial Intake/Chief Complaint:  No data recorded Current Symptoms/Problems: No data recorded  Patient Reported Schizophrenia/Schizoaffective Diagnosis in Past: No   Strengths: Pt has a supportive family, stable housing, pt is employed.  Preferences: No data recorded Abilities: No data recorded  Type of Services Patient Feels are Needed: No data recorded  Initial Clinical Notes/Concerns: No data recorded  Mental Health Symptoms Depression:   Hopelessness   Duration of Depressive symptoms:  Less than two weeks   Mania:   None   Anxiety:    Tension; Worrying   Psychosis:   None   Duration of Psychotic symptoms: No data recorded  Trauma:   N/A   Obsessions:   None   Compulsions:   None   Inattention:   None   Hyperactivity/Impulsivity:   None   Oppositional/Defiant Behaviors:   None   Emotional Irregularity:  Potentially harmful impulsivity   Other Mood/Personality Symptoms:  No data recorded   Mental Status Exam Appearance and self-care  Stature:   Average   Weight:   Average weight   Clothing:   Casual   Grooming:   Normal   Cosmetic use:   None   Posture/gait:   Normal   Motor activity:   Not Remarkable   Sensorium  Attention:   Normal   Concentration:   Normal   Orientation:   Situation; Place; Person; Object   Recall/memory:   Normal   Affect and Mood  Affect:   Not Congruent   Mood:   Dysphoric   Relating  Eye contact:   Avoided   Facial expression:   Responsive   Attitude toward examiner:   Cooperative   Thought and Language  Speech flow:  Clear and Coherent   Thought  content:   Appropriate to Mood and Circumstances   Preoccupation:   Guilt   Hallucinations:   None   Organization:  No data recorded  [employer] of Knowledge:   Average   Intelligence:   Average   Abstraction:   Normal   Judgement:   Dangerous   Reality Testing:   Adequate   Insight:   Poor   Decision Making:   Impulsive   Social Functioning  Social Maturity:   Responsible   Social Judgement:   Normal   Stress  Stressors:   Surveyor, quantity; Grief/losses   Coping Ability:   Deficient supports; Overwhelmed   Skill Deficits:   None   Supports:   Family; Support needed     Religion: Religion/Spirituality Are You A Religious Person?: Yes What is Your Religious Affiliation?: Christian How Might This Affect Treatment?: Pt reported that his faith is an anchor.  Leisure/Recreation: Leisure / Recreation Do You Have Hobbies?:  (UTA)  Exercise/Diet: Exercise/Diet Do You Exercise?:  (UTA) Have You Gained or Lost A Significant Amount of Weight in the Past Six Months?: No Do You Follow a Special Diet?: No Do You Have Any Trouble Sleeping?: No   CCA Employment/Education Employment/Work Situation: Employment / Work Situation Employment Situation: Employed Work Stressors: None reported Patient's Job has Been Impacted by Current Illness: No Has Patient ever Been in Equities trader?: No  Education: Education Is Patient Currently Attending School?: No Did Theme park manager?: No Did You Have An Individualized Education Program (IIEP): No Did You Have Any Difficulty At [employer]?: No Patient's Education Has Been Impacted by Current Illness: No   CCA Family/Childhood History Family and Relationship History: Family history Marital status: Married Number of Years Married:  (Not assessed) What types of issues is patient dealing with in the relationship?: None reported Additional relationship information: Pt has a supportive, concerned  wife. Does patient have children?: Yes How many children?: 8 How is patient's relationship with their children?: Good enough  Childhood History:  Childhood History By whom was/is the patient raised?: Both parents Did patient suffer any verbal/emotional/physical/sexual abuse as a child?: No Did patient suffer from severe childhood neglect?: No Has patient ever been sexually abused/assaulted/raped as an adolescent or adult?: No Was the patient ever a victim of a crime or a disaster?: No Witnessed domestic violence?: No Has patient been affected by domestic violence as an adult?: No  Child/Adolescent Assessment:     CCA Substance Use Alcohol/Drug Use: Alcohol / Drug Use Pain Medications: See MAR Prescriptions: See MAR Over the Counter: See MAR History of alcohol / drug use?: No history  of alcohol / drug abuse                         ASAM's:  Six Dimensions of Multidimensional Assessment  Dimension 1:  Acute Intoxication and/or Withdrawal Potential:      Dimension 2:  Biomedical Conditions and Complications:      Dimension 3:  Emotional, Behavioral, or Cognitive Conditions and Complications:     Dimension 4:  Readiness to Change:     Dimension 5:  Relapse, Continued use, or Continued Problem Potential:     Dimension 6:  Recovery/Living Environment:     ASAM Severity Score:    ASAM Recommended Level of Treatment:     Substance use Disorder (SUD)    Recommendations for Services/Supports/Treatments:    DSM5 Diagnoses: Patient Active Problem List   Diagnosis Date Noted   Preventative health care 05/05/2015   Henderson Frampton R Aline Wesche, LCAS

## 2023-05-21 NOTE — [facility] Suicide Risk Assessment (Signed)
[facility] INPATIENT:  Family/Significant Other Suicide Prevention Education  Suicide Prevention Education:  Patient Refusal for Family/Significant Other Suicide Prevention Education: The patient UBALDO SIVA has refused to provide written consent for family/significant other to be provided Family/Significant Other Suicide Prevention Education during admission and/or prior to discharge.  Physician notified.  SPE completed with pt, as pt refused to consent to family contact. SPI pamphlet provided to pt and pt was encouraged to share information with support network, ask questions, and talk about any concerns relating to SPE. Pt denies access to guns/firearms and verbalized understanding of information provided. Mobile Crisis information also provided to pt.  Glenis Smoker 05/21/2023, 1:53 PM

## 2023-05-21 NOTE — Group Note (Signed)
Recreation Therapy Group Note   Group Topic:Goal Setting  Group Date: 05/21/2023 Start Time: 1000 End Time: 1105 Facilitators: Rosina Lowenstein, LRT, CTRS Location:  Craft Room  Group Description: Scientist, research (physical sciences). Patients were given many different magazines, a glue stick, markers, and a piece of cardstock paper. LRT and pts discussed the importance of having goals in life. LRT and pts discussed the difference between short-term and long-term goals, as well as what a SMART goal is. LRT encouraged pts to create a vision board, with images they picked and then cut out with safety scissors from the magazine, for themselves, that capture their short and long-term goals. LRT encouraged pts to show and explain their vision board to the group. LRT offered to laminate vision board once dry and complete.   Goal Area(s) Addressed:  Patient will gain knowledge of short vs. long term goals.  Patient will identify goals for themselves. Patient will practice setting SMART goals. Patient will verbalize their goals to LRT and peers.  Affect/Mood: N/A   Participation Level: Did not attend    Clinical Observations/Individualized Feedback: Zachary Ryan did not attend group.  Plan: Continue to engage patient in RT group sessions 2-3x/week.   Rosina Lowenstein, LRT, CTRS 05/21/2023 11:46 AM

## 2023-05-21 NOTE — ED Triage Notes (Addendum)
Pt brought in bpd.  Pt is IVC.  Pt denies SI or HI.  Pt denies drug use.  Pt reports drinking 2 beers. Pt was walking on the railroad tracks tonight per pt. Pt calm and cooperative.

## 2023-05-21 NOTE — BHH Counselor (Signed)
Adult Comprehensive Assessment  Patient ID: Zachary Ryan, male   DOB: 06-30-1966, 57 y.o.   MRN: [id number]  Information Source: Information source: Patient  Current Stressors:  Patient states their primary concerns and needs for treatment are:: "Losing $500,000 and thinking about jumping in front of a train." Pt explains that he decided against this when he actually saw a train came through and return home. However, pt's family had already contacted police due to pt text saying he was thinking of ending his life. Patient states their goals for this hospitilization and ongoing recovery are:: Pt looking forward to discharge.  Living/Environment/Situation:  Living Arrangements: Spouse/significant other, Children  Family History:     Childhood History:     Education:     Employment/Work Situation:      Architect:      Alcohol/Substance Abuse:      Social Support System:      Leisure/Recreation:      Strengths/Needs:      Discharge Plan:      Summary/Recommendations:   Emergency planning/management officer and Recommendations (to be completed by the evaluator): Patient is a 57 year old, married, male from [place], [place]). He shared that he "lost $500,000 and was thinking of jumping in front of a train." Pt explained that he was thinking of doing it but when he saw the train come through, he decided against it and returned home. However, as police had already been contacted by his family they took him to the hospital.  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 This story was at least partially validated by his wife who did share that he does trade money on [employer] and had lost a significant amount of money within the last couple of weeks. Pt set to discharge today. He has declined outpatient mental health follow up.  Pt has a primary diagnosis of Adjustment Disorder with  mixed disturbance of emotions and conduct. It is recommended that pt follow through with [facility] if symptoms get worse, abstain from all substance use, and reconnect with his support system.  Glenis Smoker. 05/21/2023

## 2023-05-21 NOTE — [facility] Suicide Risk Assessment (Signed)
[facility] Discharge Suicide Risk Assessment   Principal Problem: Adjustment disorder with mixed disturbance of emotions and conduct Discharge Diagnoses: Principal Problem:   Adjustment disorder with mixed disturbance of emotions and conduct   Total Time spent with patient: 45 minutes  Musculoskeletal: Strength & Muscle Tone: within normal limits Gait & Station: normal Patient leans: N/A  Psychiatric Specialty Exam  Presentation  General Appearance: No data recorded Eye Contact:No data recorded Speech:No data recorded Speech Volume:No data recorded Handedness:No data recorded  Mood and Affect  Mood:No data recorded Duration of Depression Symptoms: Less than two weeks  Affect:No data recorded  Thought Process  Thought Processes:No data recorded Descriptions of Associations:No data recorded Orientation:No data recorded Thought Content:No data recorded History of Schizophrenia/Schizoaffective disorder:No  Duration of Psychotic Symptoms:No data recorded Hallucinations:No data recorded Ideas of Reference:No data recorded Suicidal Thoughts:No data recorded Homicidal Thoughts:No data recorded  Sensorium  Memory:No data recorded Judgment:No data recorded Insight:No data recorded  Executive Functions  Concentration:No data recorded Attention Span:No data recorded Recall:No data recorded Fund of Knowledge:No data recorded Language:No data recorded  Psychomotor Activity  Psychomotor Activity:No data recorded  Assets  Assets:No data recorded  Sleep  Sleep:No data recorded  Physical Exam: Physical Exam Constitutional:      Appearance: Normal appearance.  HENT:     Head: Normocephalic and atraumatic.     Mouth/Throat:     Pharynx: Oropharynx is clear.  Eyes:     Pupils: Pupils are equal, round, and reactive to light.  Cardiovascular:     Rate and Rhythm: Normal rate and regular rhythm.  Pulmonary:     Effort: Pulmonary effort is normal.     Breath sounds:  Normal breath sounds.  Abdominal:     General: Abdomen is flat.     Palpations: Abdomen is soft.  Musculoskeletal:        General: Normal range of motion.  Skin:    General: Skin is warm and dry.  Neurological:     General: No focal deficit present.     Mental Status: He is alert. Mental status is at baseline.  Psychiatric:        Attention and Perception: Attention normal.        Mood and Affect: Mood normal.        Speech: Speech normal.        Behavior: Behavior normal.        Thought Content: Thought content normal.        Cognition and Memory: Cognition normal.    Review of Systems  Constitutional: Negative.   HENT: Negative.    Eyes: Negative.   Respiratory: Negative.    Cardiovascular: Negative.   Gastrointestinal: Negative.   Musculoskeletal: Negative.   Skin: Negative.   Neurological: Negative.   Psychiatric/Behavioral: Negative.     Blood pressure (!) 144/95, pulse 78, temperature 98.3 F (36.8 C), temperature source Oral, resp. rate 14, height 5\' 10"  (1.778 m), weight 88 kg, SpO2 98 %. Body mass index is 27.84 kg/m.  Mental Status Per Nursing Assessment::   On Admission:  Self-harm thoughts  Demographic Factors:  Male and Caucasian  Loss Factors: Financial problems/change in socioeconomic status  Historical Factors: NA  Risk Reduction Factors:   Responsible for children under 75 years of age, Sense of responsibility to family, Living with another person, especially a relative, and Positive social support  Continued Clinical Symptoms:  Severe Anxiety and/or Agitation  Cognitive Features That Contribute To Risk:  None    Suicide Risk:  Minimal: No identifiable suicidal ideation.  Patients presenting with no risk factors but with morbid ruminations; may be classified as minimal risk based on the severity of the depressive symptoms    Plan Of Care/Follow-up recommendations:  Other:  Patient is being discharged today with recommendations that he  monitor himself for any further suicidal thoughts and symptoms of depression and anxiety and consider self referral for therapy if things recur or remain a problem.  Supportive counseling and therapy done.  Spoke with patient's family who agreed that they feel safe with his discharge.  No medications prescribed.  Mordecai Rasmussen, MD 05/21/2023, 11:25 AM

## 2023-05-21 NOTE — ED Notes (Signed)
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

## 2023-05-21 NOTE — Group Note (Signed)
Date:  05/21/2023 Time:  9:34 AM  Group Topic/Focus:  Mindfulness Group    Participation Level:  Did Not Attend   Zachary Ryan 05/21/2023, 9:34 AM

## 2023-05-21 NOTE — BH Assessment (Addendum)
Collateral: Bingman,Julie (Spouse) [phone] Per Pt's wife the pt would not respond to her calls and responded by texting her good bye messages. Wife explained that the pt had been fine from a mental health standpoint, prior to yesterday. Wife explained that she was completely blind sided and shocked upon receiving the pt's suicidal text messages.  Wife verbalized an understanding of the pt's disposition/plan of care.

## 2023-05-21 NOTE — Progress Notes (Signed)
Discharge Note:  Patient denies SI/HI/AVH at this time. Discharge instructions, AVS,  and transition record gone over with patient. Patient agrees to comply with medication management, follow-up visit with PCP, and outpatient therapy. Patient belongings returned to patient. No questions or concerns at present. Patient ambulatory off unit. Patient discharged to home with wife.

## 2023-05-21 NOTE — Tx Team (Signed)
Initial Treatment Plan 05/21/2023 6:32 AM Zachary Ryan ZOX:[id number]    PATIENT STRESSORS: Loss of money in stock market.   Feelings of guilt.   PATIENT STRENGTHS: Average or above average intelligence  Communication skills  General fund of knowledge  Religious Affiliation    PATIENT IDENTIFIED PROBLEMS: Patient endorsed guilt and stress over the recent loss of substantial amounts of money in the stock market.                     DISCHARGE CRITERIA:  Ability to meet basic life and health needs Adequate post-discharge living arrangements Improved stabilization in mood, thinking, and/or behavior  PRELIMINARY DISCHARGE PLAN: Outpatient therapy Return to previous living arrangement  PATIENT/FAMILY INVOLVEMENT: This treatment plan has been presented to and reviewed with the patient, Zachary Ryan.  The patient and family have been given the opportunity to ask questions and make suggestions.  Virgina Organ, RN 05/21/2023, 6:32 AM

## 2023-05-21 NOTE — ED Notes (Signed)
Report provided to [facility] RN Marylu Lund. Pt escorted in wheelchair with EDT and security to BMU. Pts 1 belongings bag and unit paperwork with EDT to handoff to [facility] staff.

## 2023-05-21 NOTE — Plan of Care (Signed)
?  Problem: Education: ?Goal: Knowledge of [facility] Education information/materials will improve ?Outcome: Adequate for Discharge ?Goal: Emotional status will improve ?Outcome: Adequate for Discharge ?Goal: Mental status will improve ?Outcome: Adequate for Discharge ?Goal: Verbalization of understanding the information provided will improve ?Outcome: Adequate for Discharge ?  ?Problem: Activity: ?Goal: Interest or engagement in activities will improve ?Outcome: Adequate for Discharge ?Goal: Sleeping patterns will improve ?Outcome: Adequate for Discharge ?  ?Problem: Coping: ?Goal: Ability to verbalize frustrations and anger appropriately will improve ?Outcome: Adequate for Discharge ?Goal: Ability to demonstrate self-control will improve ?Outcome: Adequate for Discharge ?  ?Problem: Health Behavior/Discharge Planning: ?Goal: Identification of resources available to assist in meeting health care needs will improve ?Outcome: Adequate for Discharge ?Goal: Compliance with treatment plan for underlying cause of condition will improve ?Outcome: Adequate for Discharge ?  ?Problem: Physical Regulation: ?Goal: Ability to maintain clinical measurements within normal limits will improve ?Outcome: Adequate for Discharge ?  ?Problem: Safety: ?Goal: Periods of time without injury will increase ?Outcome: Adequate for Discharge ?  ?Problem: Education: ?Goal: Ability to make informed decisions regarding treatment will improve ?Outcome: Adequate for Discharge ?  ?Problem: Coping: ?Goal: Coping ability will improve ?Outcome: Adequate for Discharge ?  ?Problem: Health Behavior/Discharge Planning: ?Goal: Identification of resources available to assist in meeting health care needs will improve ?Outcome: Adequate for Discharge ?  ?Problem: Medication: ?Goal: Compliance with prescribed medication regimen will improve ?Outcome: Adequate for Discharge ?  ?Problem: Self-Concept: ?Goal: Ability to disclose and discuss suicidal ideas  will improve ?Outcome: Adequate for Discharge ?Goal: Will verbalize positive feelings about self ?Outcome: Adequate for Discharge ?  ?

## 2023-05-21 NOTE — [facility] Suicide Risk Assessment (Signed)
[facility] Admission Suicide Risk Assessment   Nursing information obtained from:  Patient Demographic factors:  Male, Caucasian, Access to firearms Current Mental Status:  Self-harm thoughts Loss Factors:  Financial problems / change in socioeconomic status Historical Factors:  NA Risk Reduction Factors:  Religious beliefs about death, Positive social support, Responsible for children under 58 years of age, Employed, Sense of responsibility to family, Living with another person, especially a relative, Positive coping skills or problem solving skills  Total Time spent with patient: 45 minutes Principal Problem: Adjustment disorder with mixed disturbance of emotions and conduct Diagnosis:  Principal Problem:   Adjustment disorder with mixed disturbance of emotions and conduct  Subjective Data: Patient seen and chart reviewed.  57 year old man admitted after presenting to the emergency room having texted his wife that he was having suicidal thoughts.  On evaluation today the patient denies any suicidal ideation.  Denies recent depression.  Denies psychotic symptoms.  Able to discuss recent stresses rationally.  Continued Clinical Symptoms:  Alcohol Use Disorder Identification Test Final Score (AUDIT): 4 The "Alcohol Use Disorders Identification Test", Guidelines for Use in Primary Care, Second Edition.  World Science writer [facility]). Score between 0-7:  no or low risk or alcohol related problems. Score between 8-15:  moderate risk of alcohol related problems. Score between 16-19:  high risk of alcohol related problems. Score 20 or above:  warrants further diagnostic evaluation for alcohol dependence and treatment.   CLINICAL FACTORS:   Severe Anxiety and/or Agitation   Musculoskeletal: Strength & Muscle Tone: within normal limits Gait & Station: normal Patient leans: N/A  Psychiatric Specialty Exam:  Presentation  General Appearance: No data recorded Eye Contact:No data  recorded Speech:No data recorded Speech Volume:No data recorded Handedness:No data recorded  Mood and Affect  Mood:No data recorded Affect:No data recorded  Thought Process  Thought Processes:No data recorded Descriptions of Associations:No data recorded Orientation:No data recorded Thought Content:No data recorded History of Schizophrenia/Schizoaffective disorder:No  Duration of Psychotic Symptoms:No data recorded Hallucinations:No data recorded Ideas of Reference:No data recorded Suicidal Thoughts:No data recorded Homicidal Thoughts:No data recorded  Sensorium  Memory:No data recorded Judgment:No data recorded Insight:No data recorded  Executive Functions  Concentration:No data recorded Attention Span:No data recorded Recall:No data recorded Fund of Knowledge:No data recorded Language:No data recorded  Psychomotor Activity  Psychomotor Activity:No data recorded  Assets  Assets:No data recorded  Sleep  Sleep:No data recorded   Physical Exam: Physical Exam Vitals and nursing note reviewed.  Constitutional:      Appearance: Normal appearance.  HENT:     Head: Normocephalic and atraumatic.     Mouth/Throat:     Pharynx: Oropharynx is clear.  Eyes:     Pupils: Pupils are equal, round, and reactive to light.  Cardiovascular:     Rate and Rhythm: Normal rate and regular rhythm.  Pulmonary:     Effort: Pulmonary effort is normal.     Breath sounds: Normal breath sounds.  Abdominal:     General: Abdomen is flat.     Palpations: Abdomen is soft.  Musculoskeletal:        General: Normal range of motion.  Skin:    General: Skin is warm and dry.  Neurological:     General: No focal deficit present.     Mental Status: He is alert. Mental status is at baseline.  Psychiatric:        Attention and Perception: Attention normal.        Mood and Affect: Mood normal.  Speech: Speech normal.        Behavior: Behavior normal.        Thought Content: Thought  content normal.        Cognition and Memory: Cognition normal.        Judgment: Judgment normal.    Review of Systems  Constitutional: Negative.   HENT: Negative.    Eyes: Negative.   Respiratory: Negative.    Cardiovascular: Negative.   Gastrointestinal: Negative.   Musculoskeletal: Negative.   Skin: Negative.   Neurological: Negative.   Psychiatric/Behavioral: Negative.     Blood pressure (!) 144/95, pulse 78, temperature 98.3 F (36.8 C), temperature source Oral, resp. rate 14, height 5\' 10"  (1.778 m), weight 88 kg, SpO2 98 %. Body mass index is 27.84 kg/m.   COGNITIVE FEATURES THAT CONTRIBUTE TO RISK:  None    SUICIDE RISK:   Minimal: No identifiable suicidal ideation.  Patients presenting with no risk factors but with morbid ruminations; may be classified as minimal risk based on the severity of the depressive symptoms  PLAN OF CARE: Patient does not meet criteria for commitment and patient and his wife both agreed that he is safe for discharge.  Patient will be discharged.  No medications or prescriptions.  Suggestion to consider outpatient therapy.  I certify that inpatient services furnished can reasonably be expected to improve the patient's condition.   Mordecai Rasmussen, MD 05/21/2023, 11:23 AM

## 2023-05-21 NOTE — Progress Notes (Signed)
Admission Note:  The patient arrived to the BMU, accompanied by security, via IVC after endorsing suicidal ideation to police officers.  The patient was walking along train tracks after consuming 2 beers, contemplating suicide.  He has recently lost $500,000 in the stock market.  The patient denied a  documented psych history and does not take medication.  Belongings were searched, documents were signed, and vitals were taken.  Upon arrival, Bentlie denied suicidal thoughts and provided evidence of numerous protective factors/future orientation.  He was offered food and fluids before being escorted to Room 306.  The patient was cooperative, but disinterested.  CIWA score of 0 was recorded.  Dantez stated that he would remain safe while here in the BMU.  15-minute safety checks initiated.

## 2023-05-21 NOTE — H&P (Signed)
Psychiatric Admission Assessment Adult  Patient Identification: BECK TESHIMA MRN:  [id number] Date of Evaluation:  05/21/2023 Chief Complaint:  Psychosis (HCC) [F29] Principal Diagnosis: Adjustment disorder with mixed disturbance of emotions and conduct Diagnosis:  Principal Problem:   Adjustment disorder with mixed disturbance of emotions and conduct  History of Present Illness: Patient seen and chart reviewed.  This is a 57 year old man who was brought to the emergency room last night by police after they were called by the patient's family.  Patient had texted his family that he was having thoughts of suicide and they had contacted law enforcement.  Patient states that he was under a great deal of stress yesterday.  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  All of this without his wife being aware of it.  When his wife became aware of it and he realized they were down to a very small amount of reserve cash he became very anxious and upset.  Consumed 2 beers.  Walked down by the railroad tracks and had thoughts of jumping in front of a train but changed his mind after watching a train go by.  Walked back home.  Patient denies that he had been having any symptoms of depression in the days leading up to this.  States that his sleep has been fine.  Denies any medical problems.  Denies having had any suicidal thoughts in the days leading up to this.  Denies any psychotic symptoms.  Patient is not receiving any mental health care and is not on any prescription medicine.  I spoke with his wife for collateral information and she confirmed the story telling me she had been unaware that he was having any kind of distress or problems and was completely blindsided by his text message.  However she does confirm to me that it is possible and appears to be true that he lost that much money over the last couple  weeks. Associated Signs/Symptoms: Depression Symptoms:  depressed mood, feelings of worthlessness/guilt, suicidal thoughts with specific plan, (Hypo) Manic Symptoms:   None Anxiety Symptoms:   Acute anxiety about the specific situation Psychotic Symptoms:   None PTSD Symptoms: Negative Total Time spent with patient: 45 minutes  Past Psychiatric History: No past psychiatric history.  No history of diagnosis with her complaint of any mental health conditions.  No previous hospitalization.  No history of suicide attempts.  Never been on any psychiatric medicine.  States that he drinks alcohol about one drink a day does not have an alcohol or drug problem.  Wife confirms this.  Is the patient at risk to self? No.  Has the patient been a risk to self in the past 6 months? Yes.    Has the patient been a risk to self within the distant past? No.  Is the patient a risk to others? No.  Has the patient been a risk to others in the past 6 months? No.  Has the patient been a risk to others within the distant past? No.   [place] Scale:  Flowsheet Row Admission (Current) from 05/21/2023 in [facility]PAT[facility]AL MEDICINE Most recent reading at 05/21/2023  5:45 AM ED from 05/21/2023 in [facility] Emergency Department at [facility] Most recent reading at 05/21/2023  1:12 AM  C-SSRS RISK CATEGORY High Risk High Risk        Prior Inpatient Therapy: No. If yes,  describe none Prior Outpatient Therapy: No. If yes, describe none  Alcohol Screening: 1. How often do you have a drink containing alcohol?: 4 or more times a week 2. How many drinks containing alcohol do you have on a typical day when you are drinking?: 1 or 2 3. How often do you have six or more drinks on one occasion?: Never AUDIT-C Score: 4 4. How often during the last year have you found that you were not able to stop drinking once you had started?: Never 5. How often during the last year have you failed to do what was normally  expected from you because of drinking?: Never 6. How often during the last year have you needed a first drink in the morning to get yourself going after a heavy drinking session?: Never 7. How often during the last year have you had a feeling of guilt of remorse after drinking?: Never 8. How often during the last year have you been unable to remember what happened the night before because you had been drinking?: Never 9. Have you or someone else been injured as a result of your drinking?: No 10. Has a relative or friend or a doctor or another health worker been concerned about your drinking or suggested you cut down?: No Alcohol Use Disorder Identification Test Final Score (AUDIT): 4 Alcohol Brief Interventions/Follow-up: Alcohol education/Brief advice Substance Abuse History in the last 12 months:  No. Consequences of Substance Abuse: Negative Previous Psychotropic Medications: No  Psychological Evaluations: No  Past Medical History:  Past Medical History:  Diagnosis Date   Chicken pox     Past Surgical History:  Procedure Laterality Date   MENISCUS REPAIR Right    Family History:  Family History  Problem Relation Age of Onset   Dementia Mother    Family Psychiatric  History: Mother with dementia and no other mental health problems in the family Tobacco Screening:  Social History   Tobacco Use  Smoking Status Never  Smokeless Tobacco Never    BH Tobacco Counseling     Are you interested in Tobacco Cessation Medications?  No value filed. Counseled patient on smoking cessation:  N/A, patient does not use tobacco products Reason Tobacco Screening Not Completed: No value filed.       Social History:  Social History   Substance and Sexual Activity  Alcohol Use Yes   Alcohol/week: 0.0 standard drinks of alcohol   Comment: occ     Social History   Substance and Sexual Activity  Drug Use Not on file    Additional Social History:                            Allergies:  No Known Allergies Lab Results:  Results for orders placed or performed during the hospital encounter of 05/21/23 (from the past 48 hour(s))  Urine Drug Screen, Qualitative     Status: None   Collection Time: 05/21/23  1:14 AM  Result Value Ref Range   Tricyclic, Ur Screen NONE DETECTED NONE DETECTED   Amphetamines, Ur Screen NONE DETECTED NONE DETECTED   MDMA (Ecstasy)Ur Screen NONE DETECTED NONE DETECTED   Cocaine Metabolite,Ur [place] NONE DETECTED NONE DETECTED   Opiate, Ur Screen NONE DETECTED NONE DETECTED   Phencyclidine (PCP) Ur S NONE DETECTED NONE DETECTED   Cannabinoid 50 Ng, Ur [place] NONE DETECTED NONE DETECTED   Barbiturates, Ur Screen NONE DETECTED NONE DETECTED   Benzodiazepine, Ur Scrn NONE DETECTED  NONE DETECTED   Methadone Scn, Ur NONE DETECTED NONE DETECTED    Comment: (NOTE) Tricyclics + metabolites, urine    Cutoff 1000 ng/mL Amphetamines + metabolites, urine  Cutoff 1000 ng/mL MDMA (Ecstasy), urine              Cutoff 500 ng/mL Cocaine Metabolite, urine          Cutoff 300 ng/mL Opiate + metabolites, urine        Cutoff 300 ng/mL Phencyclidine (PCP), urine         Cutoff 25 ng/mL Cannabinoid, urine                 Cutoff 50 ng/mL Barbiturates + metabolites, urine  Cutoff 200 ng/mL Benzodiazepine, urine              Cutoff 200 ng/mL Methadone, urine                   Cutoff 300 ng/mL  The urine drug screen provides only a preliminary, unconfirmed analytical test result and should not be used for non-medical purposes. Clinical consideration and professional judgment should be applied to any positive drug screen result due to possible interfering substances. A more specific alternate chemical method must be used in order to obtain a confirmed analytical result. Gas chromatography / mass spectrometry (GC/MS) is the preferred confirm atory method. Performed at [facility], [address] [place], [place] [address]   Comprehensive metabolic  panel     Status: Abnormal   Collection Time: 05/21/23  1:15 AM  Result Value Ref Range   Sodium 137 135 - 145 mmol/L   Potassium 3.9 3.5 - 5.1 mmol/L   Chloride 103 98 - 111 mmol/L   CO2 21 (L) 22 - 32 mmol/L   Glucose, Bld 97 70 - 99 mg/dL    Comment: Glucose reference range applies only to samples taken after fasting for at least 8 hours.   BUN 16 6 - 20 mg/dL   Creatinine, Ser 2.95 0.61 - 1.24 mg/dL   Calcium 9.1 8.9 - 28.4 mg/dL   Total Protein 7.6 6.5 - 8.1 g/dL   Albumin 4.8 3.5 - 5.0 g/dL   AST 42 (H) 15 - 41 U/L   ALT 34 0 - 44 U/L   Alkaline Phosphatase 51 38 - 126 U/L   Total Bilirubin 1.0 0.3 - 1.2 mg/dL   GFR, Estimated >13 >24 mL/min    Comment: (NOTE) Calculated using the CKD-EPI Creatinine Equation (2021)    Anion gap 13 5 - 15    Comment: Performed at [facility], [address] [place], [place] [address]  Ethanol     Status: Abnormal   Collection Time: 05/21/23  1:15 AM  Result Value Ref Range   Alcohol, Ethyl (B) 24 (H) <10 mg/dL    Comment: (NOTE) Lowest detectable limit for serum alcohol is 10 mg/dL.  For medical purposes only. Performed at [facility], [address] [place], [place] [address]   Salicylate level     Status: Abnormal   Collection Time: 05/21/23  1:15 AM  Result Value Ref Range   Salicylate Lvl <7.0 (L) 7.0 - 30.0 mg/dL    Comment: Performed at [facility], [address] [place], [place] [address]  Acetaminophen level     Status: Abnormal   Collection Time: 05/21/23  1:15 AM  Result Value Ref Range   Acetaminophen (Tylenol), Serum <10 (L) 10 - 30 ug/mL  Comment: (NOTE) Therapeutic concentrations vary significantly. A range of 10-30 ug/mL  may be an effective concentration for many patients. However, some  are best treated at concentrations outside of this range. Acetaminophen concentrations >150 ug/mL at 4 hours after ingestion  and >50 ug/mL at 12 hours after ingestion are often associated  with  toxic reactions.  Performed at [facility], [address] [place], [place] [address]   cbc     Status: None   Collection Time: 05/21/23  1:15 AM  Result Value Ref Range   WBC 7.9 4.0 - 10.5 K/uL   RBC 4.93 4.22 - 5.81 MIL/uL   Hemoglobin 15.8 13.0 - 17.0 g/dL   HCT 60.4 54.0 - 98.1 %   MCV 92.9 80.0 - 100.0 fL   MCH 32.0 26.0 - 34.0 pg   MCHC 34.5 30.0 - 36.0 g/dL   RDW 19.1 47.8 - 29.5 %   Platelets 196 150 - 400 K/uL   nRBC 0.0 0.0 - 0.2 %    Comment: Performed at [facility], [address] [place], [place] [address]  SARS Coronavirus 2 by RT PCR (hospital order, performed in [facility] hospital lab) *cepheid single result test* Anterior Nasal Swab     Status: None   Collection Time: 05/21/23  4:53 AM   Specimen: Anterior Nasal Swab  Result Value Ref Range   SARS Coronavirus 2 by RT PCR NEGATIVE NEGATIVE    Comment: (NOTE) SARS-CoV-2 target nucleic acids are NOT DETECTED.  The SARS-CoV-2 RNA is generally detectable in upper and lower respiratory specimens during the acute phase of infection. The lowest concentration of SARS-CoV-2 viral copies this assay can detect is 250 copies / mL. A negative result does not preclude SARS-CoV-2 infection and should not be used as the sole basis for treatment or other patient management decisions.  A negative result may occur with improper specimen collection / handling, submission of specimen other than nasopharyngeal swab, presence of viral mutation(s) within the areas targeted by this assay, and inadequate number of viral copies (<250 copies / mL). A negative result must be combined with clinical observations, patient history, and epidemiological information.  Fact Sheet for Patients:   RoadLapTop.co.za  Fact Sheet for Healthcare Providers: http://kim-miller.com/  This test is not yet approved or  cleared by the [place] FDA and has been authorized  for detection and/or diagnosis of SARS-CoV-2 by FDA under an Emergency Use Authorization (EUA).  This EUA will remain in effect (meaning this test can be used) for the duration of the COVID-19 declaration under Section 564(b)(1) of the Act, 21 U.S.C. section 360bbb-3(b)(1), unless the authorization is terminated or revoked sooner.  Performed at [facility], [address] [place], [place] [address]     Blood Alcohol level:  Lab Results  Component Value Date   ETH 24 (H) 05/21/2023    Metabolic Disorder Labs:  No results found for: "HGBA1C", "MPG" No results found for: "PROLACTIN" Lab Results  Component Value Date   CHOL 212 (H) 10/16/2018   TRIG 63.0 10/16/2018   HDL 67.00 10/16/2018   CHOLHDL 3 10/16/2018   VLDL 12.6 10/16/2018   LDLCALC 133 (H) 10/16/2018   LDLCALC 132 (H) 08/27/2017    Current Medications: Current Facility-Administered Medications  Medication Dose Route Frequency Provider Last Rate Last Admin   acetaminophen (TYLENOL) tablet 650 mg  650 mg Oral Q6H PRN Sindy Guadeloupe, NP       alum & mag hydroxide-simeth (MAALOX/MYLANTA) 200-200-20 MG/5ML  suspension 30 mL  30 mL Oral Q4H PRN Sindy Guadeloupe, NP       OLANZapine zydis (ZYPREXA) disintegrating tablet 10 mg  10 mg Oral Q8H PRN Sindy Guadeloupe, NP       And   LORazepam (ATIVAN) tablet 1 mg  1 mg Oral PRN Sindy Guadeloupe, NP       And   ziprasidone (GEODON) injection 20 mg  20 mg Intramuscular PRN Sindy Guadeloupe, NP       magnesium hydroxide (MILK OF MAGNESIA) suspension 30 mL  30 mL Oral Daily PRN Sindy Guadeloupe, NP       PTA Medications: No medications prior to admission.    Musculoskeletal: Strength & Muscle Tone: within normal limits Gait & Station: normal Patient leans: N/A            Psychiatric Specialty Exam:  Presentation  General Appearance: No data recorded Eye Contact:No data recorded Speech:No data recorded Speech Volume:No data recorded Handedness:No data  recorded  Mood and Affect  Mood:No data recorded Affect:No data recorded  Thought Process  Thought Processes:No data recorded Duration of Psychotic Symptoms:N/A Past Diagnosis of Schizophrenia or Psychoactive disorder: No  Descriptions of Associations:No data recorded Orientation:No data recorded Thought Content:No data recorded Hallucinations:No data recorded Ideas of Reference:No data recorded Suicidal Thoughts:No data recorded Homicidal Thoughts:No data recorded  Sensorium  Memory:No data recorded Judgment:No data recorded Insight:No data recorded  Executive Functions  Concentration:No data recorded Attention Span:No data recorded Recall:No data recorded Fund of Knowledge:No data recorded Language:No data recorded  Psychomotor Activity  Psychomotor Activity:No data recorded  Assets  Assets:No data recorded  Sleep  Sleep:No data recorded   Physical Exam: Physical Exam Vitals and nursing note reviewed.  Constitutional:      Appearance: Normal appearance.  HENT:     Head: Normocephalic and atraumatic.     Mouth/Throat:     Pharynx: Oropharynx is clear.  Eyes:     Pupils: Pupils are equal, round, and reactive to light.  Cardiovascular:     Rate and Rhythm: Normal rate and regular rhythm.  Pulmonary:     Effort: Pulmonary effort is normal.     Breath sounds: Normal breath sounds.  Abdominal:     General: Abdomen is flat.     Palpations: Abdomen is soft.  Musculoskeletal:        General: Normal range of motion.  Skin:    General: Skin is warm and dry.  Neurological:     General: No focal deficit present.     Mental Status: He is alert. Mental status is at baseline.  Psychiatric:        Attention and Perception: Attention normal.        Mood and Affect: Mood normal.        Speech: Speech normal.        Behavior: Behavior normal.        Thought Content: Thought content normal.        Cognition and Memory: Cognition normal.        Judgment: Judgment  normal.    Review of Systems  Constitutional: Negative.   HENT: Negative.    Eyes: Negative.   Respiratory: Negative.    Cardiovascular: Negative.   Gastrointestinal: Negative.   Musculoskeletal: Negative.   Skin: Negative.   Neurological: Negative.   Psychiatric/Behavioral: Negative.     Blood pressure (!) 144/95, pulse 78, temperature 98.3 F (36.8 C), temperature source Oral, resp. rate 14, height 5\' 10"  (1.778 m), weight 88  kg, SpO2 98 %. Body mass index is 27.84 kg/m.  Treatment Plan Summary: Plan patient appears to have had an acute adjustment disorder with transient suicidal ideation which she did not act on.  No psychotic symptoms.  Currently denies suicidal ideation.  Able to discuss stresses in a rational way.  I spoke with his wife and she states that she believes he is safe to come home.  I have asked her to take the one gun that they own and remove it from the home to get it somewhere he cannot get his hands on it and she has agreed to do this.  No indication to start medication.  Advised patient and wife that if symptoms continue or recur or there is any further suicidal thought he should inform his family immediately and consider seeking therapy.  Observation Level/Precautions:  15 minute checks  Laboratory:  UDS  Psychotherapy:    Medications:    Consultations:    Discharge Concerns:    Estimated LOS:  Other:     Physician Treatment Plan for Primary Diagnosis: Adjustment disorder with mixed disturbance of emotions and conduct Long Term Goal(s): Improvement in symptoms so as ready for discharge  Short Term Goals: Ability to verbalize feelings will improve  Physician Treatment Plan for Secondary Diagnosis: Principal Problem:   Adjustment disorder with mixed disturbance of emotions and conduct  Long Term Goal(s): Improvement in symptoms so as ready for discharge  Short Term Goals: Ability to verbalize feelings will improve  I certify that inpatient services  furnished can reasonably be expected to improve the patient's condition.    Mordecai Rasmussen, MD 6/4/202411:30 AM

## 2023-05-21 NOTE — Discharge Summary (Signed)
Physician Discharge Summary Note  Patient:  Zachary Ryan is an 57 y.o., male MRN:  [id number] DOB:  1966-05-27 Patient phone:  [phone] (home)  Patient address:   [address] [place] [address],  Total Time spent with patient: 45 minutes  Date of Admission:  05/21/2023 Date of Discharge: 05/21/2023  Reason for Admission: Admitted after presenting to the emergency room with reports of recent suicidal thoughts.  Principal Problem: Adjustment disorder with mixed disturbance of emotions and conduct Discharge Diagnoses: Principal Problem:   Adjustment disorder with mixed disturbance of emotions and conduct   Past Psychiatric History: No past psychiatric history  Past Medical History:  Past Medical History:  Diagnosis Date   Chicken pox     Past Surgical History:  Procedure Laterality Date   MENISCUS REPAIR Right    Family History:  Family History  Problem Relation Age of Onset   Dementia Mother    Family Psychiatric  History: None Social History:  Social History   Substance and Sexual Activity  Alcohol Use Yes   Alcohol/week: 0.0 standard drinks of alcohol   Comment: occ     Social History   Substance and Sexual Activity  Drug Use Not on file    Social History   Socioeconomic History   Marital status: Unknown    Spouse name: Not on file   Number of children: Not on file   Years of education: Not on file   Highest education level: Not on file  Occupational History   Not on file  Tobacco Use   Smoking status: Never   Smokeless tobacco: Never  Substance and Sexual Activity   Alcohol use: Yes    Alcohol/week: 0.0 standard drinks of alcohol    Comment: occ   Drug use: Not on file   Sexual activity: Not on file  Other Topics Concern   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    Enjoys gardening, traveling.   Social Determinants of Health   Financial Resource Strain: Not on file   Food Insecurity: No Food Insecurity (05/21/2023)   Hunger Vital Sign    Worried About Running Out of Food in the Last Year: Never true    Ran Out of Food in the Last Year: Never true  Transportation Needs: No Transportation Needs (05/21/2023)   PRAPARE - Administrator, Civil Service (Medical): No    Lack of Transportation (Non-Medical): No  Physical Activity: Not on file  Stress: Not on file  Social Connections: Not on file    Hospital Course: Admitted to psychiatric hospital.  15-minute checks continued.  Labs reviewed.  Patient did not show any dangerous behavior in the hospital and has been cooperative with assessment and treatment.  He allowed me to speak with his wife for collateral information.  Patient and wife tell congruent stories that he had not displayed or been experiencing any mental health distress recently but the stress of having lost a large amount of money gambling in the stock market caused him to have transient suicidal ideation.  Patient now denies any suicidal thoughts and has no sign of psychosis or ongoing depression.  Patient received psychoeducation and supportive counseling.  I advised the wife to remove the gun from the home and she agreed to do that.  No indication to start any medication.  Advised patient that if symptoms continue or persist and certainly  if he has any suicidal thoughts he should inform his wife immediately and seek outpatient therapy which can be done by talking with his primary care doctor or going through his insurance list if needed.  Discontinue IVC.  Patient will be discharged from the hospital today  Physical Findings: AIMS: Facial and Oral Movements Muscles of Facial Expression: None, normal Lips and Perioral Area: None, normal Jaw: None, normal Tongue: None, normal,Extremity Movements Upper (arms, wrists, hands, fingers): None, normal Lower (legs, knees, ankles, toes): None, normal, Trunk Movements Neck, shoulders, hips: None,  normal, Overall Severity Severity of abnormal movements (highest score from questions above): None, normal Incapacitation due to abnormal movements: None, normal Patient's awareness of abnormal movements (rate only patient's report): No Awareness, Dental Status Current problems with teeth and/or dentures?: No Does patient usually wear dentures?: No  CIWA:  CIWA-Ar Total: 0 COWS:     Musculoskeletal: Strength & Muscle Tone: within normal limits Gait & Station: normal Patient leans: N/A   Psychiatric Specialty Exam:  Presentation  General Appearance: No data recorded Eye Contact:No data recorded Speech:No data recorded Speech Volume:No data recorded Handedness:No data recorded  Mood and Affect  Mood:No data recorded Affect:No data recorded  Thought Process  Thought Processes:No data recorded Descriptions of Associations:No data recorded Orientation:No data recorded Thought Content:No data recorded History of Schizophrenia/Schizoaffective disorder:No  Duration of Psychotic Symptoms:No data recorded Hallucinations:No data recorded Ideas of Reference:No data recorded Suicidal Thoughts:No data recorded Homicidal Thoughts:No data recorded  Sensorium  Memory:No data recorded Judgment:No data recorded Insight:No data recorded  Executive Functions  Concentration:No data recorded Attention Span:No data recorded Recall:No data recorded Fund of Knowledge:No data recorded Language:No data recorded  Psychomotor Activity  Psychomotor Activity:No data recorded  Assets  Assets:No data recorded  Sleep  Sleep:No data recorded   Physical Exam: Physical Exam Vitals and nursing note reviewed.  Constitutional:      Appearance: Normal appearance.  HENT:     Head: Normocephalic and atraumatic.     Mouth/Throat:     Pharynx: Oropharynx is clear.  Eyes:     Pupils: Pupils are equal, round, and reactive to light.  Cardiovascular:     Rate and Rhythm: Normal rate and  regular rhythm.  Pulmonary:     Effort: Pulmonary effort is normal.     Breath sounds: Normal breath sounds.  Abdominal:     General: Abdomen is flat.     Palpations: Abdomen is soft.  Musculoskeletal:        General: Normal range of motion.  Skin:    General: Skin is warm and dry.  Neurological:     General: No focal deficit present.     Mental Status: He is alert. Mental status is at baseline.  Psychiatric:        Attention and Perception: Attention normal.        Mood and Affect: Mood normal.        Speech: Speech normal.        Behavior: Behavior is cooperative.        Thought Content: Thought content normal.        Cognition and Memory: Cognition normal.        Judgment: Judgment normal.    Review of Systems  Constitutional: Negative.   HENT: Negative.    Eyes: Negative.   Respiratory: Negative.    Cardiovascular: Negative.   Gastrointestinal: Negative.   Musculoskeletal: Negative.   Skin: Negative.   Neurological: Negative.   Psychiatric/Behavioral: Negative.  Blood pressure (!) 144/95, pulse 78, temperature 98.3 F (36.8 C), temperature source Oral, resp. rate 14, height 5\' 10"  (1.778 m), weight 88 kg, SpO2 98 %. Body mass index is 27.84 kg/m.   Social History   Tobacco Use  Smoking Status Never  Smokeless Tobacco Never   Tobacco Cessation:  N/A, patient does not currently use tobacco products   Blood Alcohol level:  Lab Results  Component Value Date   ETH 24 (H) 05/21/2023    Metabolic Disorder Labs:  No results found for: "HGBA1C", "MPG" No results found for: "PROLACTIN" Lab Results  Component Value Date   CHOL 212 (H) 10/16/2018   TRIG 63.0 10/16/2018   HDL 67.00 10/16/2018   CHOLHDL 3 10/16/2018   VLDL 12.6 10/16/2018   LDLCALC 133 (H) 10/16/2018   LDLCALC 132 (H) 08/27/2017    See Psychiatric Specialty Exam and Suicide Risk Assessment completed by Attending Physician prior to discharge.  Discharge destination:  Home  Is patient  on multiple antipsychotic therapies at discharge:  No   Has Patient had three or more failed trials of antipsychotic monotherapy by history:  No  Recommended Plan for Multiple Antipsychotic Therapies: NA  Discharge Instructions     Diet - low sodium heart healthy   Complete by: As directed    Increase activity slowly   Complete by: As directed       Allergies as of 05/21/2023   No Known Allergies      Medication List    You have not been prescribed any medications.      Follow-up recommendations:  Other:  No specific referral and no medication.  Advised patient to monitor himself for mood symptoms and especially suicidal ideation and to keep his family informed of any symptoms and to seek help if any of this recurs.  Comments: No prescriptions required  Signed: Mordecai Rasmussen, MD 05/21/2023, 11:36 AM

## 2023-05-21 NOTE — Progress Notes (Signed)
  [facility] Adult Case Management Discharge Plan :  Will you be returning to the same living situation after discharge:  Yes,  pt plans to return home upon discharge. At discharge, do you have transportation home?: Yes,  pt wife providing transportation home. Do you have the ability to pay for your medications: No.  Release of information consent forms completed and in the chart;  Patient's signature needed at discharge.  Patient to Follow up at:  Follow-up Information     [facility] [place]. Call.   Specialty: Behavioral Health Why: If symptoms worsen please call clinic to schedule an appointment to see a psychiatrist. Contact information: [address] [place] [address] [id number] mc [place] [address] [phone]                Next level of care provider has access to [facility] Link:no  Safety Planning and Suicide Prevention discussed: Yes,  SPE completed with pt.     Has patient been referred to the Quitline?: Patient does not use tobacco/nicotine products  Patient has been referred for addiction treatment: No known substance use disorder.  Glenis Smoker, LCSW 05/21/2023, 1:54 PM

## 2023-05-21 NOTE — BH Assessment (Signed)
Patient is to be admitted to [facility] by Psychiatric Nurse Practitioner  Sindy Guadeloupe .  Attending Physician will be Dr.  Toni Amend .   Patient has been assigned to room 306, by [facility].   Intake Paper Work has been signed and placed on patient chart.  ER staff is aware of the admission: Carlene, ER Secretary   Dr. York Cerise, ER MD  Erie Noe, Patient's Nurse  Dawn, Patient Access.   Pt can be transported ASAP 05/21/23.
